# Patient Record
Sex: Female | Born: 1955
Health system: Southern US, Community
[De-identification: ages and names within clinical notes are randomized; demographics above are authoritative.]

## PROBLEM LIST (undated history)

## (undated) DIAGNOSIS — I10 Essential (primary) hypertension: Secondary | ICD-10-CM

## (undated) DIAGNOSIS — I341 Nonrheumatic mitral (valve) prolapse: Secondary | ICD-10-CM

## (undated) DIAGNOSIS — I499 Cardiac arrhythmia, unspecified: Secondary | ICD-10-CM

## (undated) HISTORY — PX: DIAGNOSTIC LAPAROSCOPY: SUR761

---

## 2010-07-24 ENCOUNTER — Other Ambulatory Visit (HOSPITAL_COMMUNITY)
Admission: RE | Admit: 2010-07-24 | Discharge: 2010-07-24 | Disposition: A | Discharge status: Home / Self Care | Payer: BC Managed Care – PPO | Source: Ambulatory Visit | Attending: Obstetrics and Gynecology | Admitting: Obstetrics and Gynecology

## 2010-07-24 DIAGNOSIS — Z01419 Encounter for gynecological examination (general) (routine) without abnormal findings: Secondary | ICD-10-CM | POA: Insufficient documentation

## 2010-08-07 ENCOUNTER — Other Ambulatory Visit: Payer: Self-pay | Admitting: Obstetrics and Gynecology

## 2010-08-07 ENCOUNTER — Encounter (HOSPITAL_COMMUNITY): Payer: BC Managed Care – PPO

## 2010-08-07 LAB — CBC
MCH: 32 pg (ref 26.0–34.0)
MCHC: 33.8 g/dL (ref 30.0–36.0)
MCV: 94.7 fL (ref 78.0–100.0)
Platelets: 244 10*3/uL (ref 150–400)
RDW: 13.4 % (ref 11.5–15.5)

## 2010-08-10 ENCOUNTER — Other Ambulatory Visit: Payer: Self-pay | Admitting: Obstetrics and Gynecology

## 2010-08-10 ENCOUNTER — Ambulatory Visit (HOSPITAL_COMMUNITY)
Admission: RE | Admit: 2010-08-10 | Discharge: 2010-08-10 | Disposition: A | Discharge status: Home / Self Care | Payer: BC Managed Care – PPO | Source: Ambulatory Visit | Attending: Obstetrics and Gynecology | Admitting: Obstetrics and Gynecology

## 2010-08-10 DIAGNOSIS — N840 Polyp of corpus uteri: Secondary | ICD-10-CM | POA: Insufficient documentation

## 2010-08-10 DIAGNOSIS — Z01812 Encounter for preprocedural laboratory examination: Secondary | ICD-10-CM | POA: Insufficient documentation

## 2010-08-10 DIAGNOSIS — Z01818 Encounter for other preprocedural examination: Secondary | ICD-10-CM | POA: Insufficient documentation

## 2010-08-13 NOTE — Op Note (Signed)
  NAMEAMEIA, Diana Case              ACCOUNT NO.:  [id number]  MEDICAL RECORD NO.:  [id number]  LOCATION:                                 FACILITY:  PHYSICIAN:  Patsy Baltimore, MD     DATE OF BIRTH:  [birth year]  DATE OF PROCEDURE: DATE OF DISCHARGE:                              OPERATIVE REPORT   PREOPERATIVE DIAGNOSIS:  Uterine polyp.  POSTOPERATIVE DIAGNOSIS:  Uterine polyp.  PROCEDURE PERFORMED:  Hysteroscopy myosure polypectomy.  SURGEON:  Patsy Baltimore, MD.  ANESTHESIA:  General.  FINDINGS:  Multiple uterine polyps.  SPECIMENS SENT:  Endometrial polyps.  ESTIMATED BLOOD LOSS:  Minimal.  COMPLICATIONS:  None.  DESCRIPTION OF PROCEDURE:  [name] is a 55 year old para 3, who was seen as an outpatient for postmenopausal bleeding.  Her workup revealed the polypoid projections within the endometrium, therefore, informed consent was obtained for removal of the polyps.  On the day of the surgery, she was taken to the operating room with IV fluids running. She was put under general anesthesia.  Her legs are lifted up to the dorsal lithotomy position.  She was prepped and draped in the usual sterile fashion and then we began the procedure.  Speculum was inserted into the vagina.  A single-toothed tenaculum was used to grasp the anterior lip of the cervix.  The myosure hysteroscope was then inserted with normal saline infusing.  There was good visualization of the endometrial cavity.  She had multiple polyps, one by the left ostia, another projecting from the anterior endometrium, and then another from the anterior lower uterine segment.  All three polyps were resected with the myosure.  At the end of the case, the uterine cavity appeared clean without any polyps remaining.  She tolerated the procedure well.  The instruments were removed from the vagina.  The puncture sites of the tenaculum were hemostatic.  The patient was reawakened and transferred to the PACU  in stable condition.          ______________________________ Patsy Baltimore, MD     CO/MEDQ  D:  08/10/2010  T:  08/11/2010  Job:  [id number]  Electronically Signed by Patsy Baltimore MD on 08/13/2010 10:01:19 AM

## 2011-08-27 DIAGNOSIS — B009 Herpesviral infection, unspecified: Secondary | ICD-10-CM | POA: Insufficient documentation

## 2012-01-06 DIAGNOSIS — Z961 Presence of intraocular lens: Secondary | ICD-10-CM | POA: Insufficient documentation

## 2012-01-06 DIAGNOSIS — H3322 Serous retinal detachment, left eye: Secondary | ICD-10-CM | POA: Insufficient documentation

## 2012-01-06 DIAGNOSIS — H33319 Horseshoe tear of retina without detachment, unspecified eye: Secondary | ICD-10-CM | POA: Insufficient documentation

## 2013-07-20 ENCOUNTER — Other Ambulatory Visit: Payer: Self-pay | Admitting: Family Medicine

## 2013-07-20 DIAGNOSIS — Z1231 Encounter for screening mammogram for malignant neoplasm of breast: Secondary | ICD-10-CM

## 2013-07-20 DIAGNOSIS — E2839 Other primary ovarian failure: Secondary | ICD-10-CM

## 2013-08-22 ENCOUNTER — Ambulatory Visit
Admission: RE | Admit: 2013-08-22 | Discharge: 2013-08-22 | Disposition: A | Discharge status: Home / Self Care | Payer: BC Managed Care – PPO | Source: Ambulatory Visit | Attending: Family Medicine | Admitting: Family Medicine

## 2013-08-22 ENCOUNTER — Other Ambulatory Visit: Payer: Self-pay | Admitting: Family Medicine

## 2013-08-22 DIAGNOSIS — Z1231 Encounter for screening mammogram for malignant neoplasm of breast: Secondary | ICD-10-CM

## 2013-08-22 DIAGNOSIS — R928 Other abnormal and inconclusive findings on diagnostic imaging of breast: Secondary | ICD-10-CM

## 2013-08-22 DIAGNOSIS — E2839 Other primary ovarian failure: Secondary | ICD-10-CM

## 2013-08-23 ENCOUNTER — Other Ambulatory Visit: Payer: Self-pay | Admitting: Family Medicine

## 2013-08-23 DIAGNOSIS — R928 Other abnormal and inconclusive findings on diagnostic imaging of breast: Secondary | ICD-10-CM

## 2013-08-24 ENCOUNTER — Other Ambulatory Visit: Payer: Self-pay | Admitting: Obstetrics & Gynecology

## 2013-08-30 ENCOUNTER — Other Ambulatory Visit: Payer: BC Managed Care – PPO

## 2013-09-03 ENCOUNTER — Other Ambulatory Visit: Payer: BC Managed Care – PPO

## 2013-09-12 ENCOUNTER — Ambulatory Visit
Admission: RE | Admit: 2013-09-12 | Discharge: 2013-09-12 | Disposition: A | Discharge status: Home / Self Care | Payer: BC Managed Care – PPO | Source: Ambulatory Visit | Attending: Family Medicine | Admitting: Family Medicine

## 2013-09-12 DIAGNOSIS — R928 Other abnormal and inconclusive findings on diagnostic imaging of breast: Secondary | ICD-10-CM

## 2013-09-14 ENCOUNTER — Other Ambulatory Visit: Payer: Self-pay | Admitting: Family Medicine

## 2013-09-14 DIAGNOSIS — R921 Mammographic calcification found on diagnostic imaging of breast: Secondary | ICD-10-CM

## 2013-12-20 NOTE — H&P (Signed)
H&P 58yo postmenopausal [id number] female who presents for laparoscopic hysterectomy, bilateral salpingectomy due to postmenopausal bleeding, complex hyperplasia and suspected uterine polyp. In review, the patient has had several episodes of postmenopausal bleeding- last episode in July. A full work up was performed including both US and EMB, which showed concern for both a uterine polyp and complex endometrial hyperplasia no atypia. US-July 2015: Anteverted uterus measuring 7.2x3.8x4.5cm with hyperechoic mass within the cavity measuring 2.2 x 1.5x1cm, total endometrial thickenss 3.1 cm. Bilateral ovaries unremarkable. In 2012, she had a similar issue, where she underwent a hysteroscopy, D&C due to uterine polyps; however it appears as though the polyp have returned. At this time, she desires to proceed with permanent resolution of these issues.       Medical History: HTN, Hyperlipidemia       Gyn History:  Sexual activity currently sexually active.  Periods : postmenopausal.  LMP 08/2008.  Denies H/O Birth control.  Last pap smear date June 2015 Negative.  Denies H/O Last mammogram date 08/31/12.  Abnormal pap smear s/p colposcopy, treated with cryo.  Menarche 11.        OB History:  Number of pregnancies 4.  miscarriages 1.  Pregnancy # 1 live birth, vaginal delivery.  Pregnancy # 2 miscarriage.  Pregnancy # 3 live birth, vaginal delivery.  Pregnancy # 4: live birth, C-section.        Surgical History: left eye repair of a retinal detachment , bilateral cataract surgery ,  c-section x1 , Hysteroscopy and Polypectomy .        Hospitalization/Major Diagnostic Procedure: childbirth .        Family History: Father: alive 1384 yrs, Bladder cancer, CAD Mother: alive 6483 yrs, Hypertension Paternal Grand Father: deceased, CADPaternal Grand  Mother: deceased 6093 yrs, Cervical CA ? Maternal Grand Father: CVA Maternal Grand Mother: deceased 8093 yrs Brother 1: alive, skin cancer       Social History:  General:  Tobacco use  cigarettes: Never smoked Tobacco history last updated 02/08/2013 no Smoking.  Alcohol: moderate to heavy.  Caffeine: None.  Recreational drug use: no.  Diet: vegetarian, low salt, low fat.  Exercise: bikes, 4 x week.  Occupation: Runner, broadcasting/film/videoteacher, Scientific laboratory technicianpart-time tutor.  Education: [employer].  Marital Status: Married.  Children: 3 adult children.    Medications: Current Medication:  Taking  Ibuprofen 200 MG Tablet 1 tablet prn     Fish Oil 1000 MG Capsule 1 capsule with a meal Once a day     Zicam Allergy Relief Gel     Norvasc 5 MG Tablet 1 tablet once a day-needs office visit before more     Tums    Review of Systems  CONSTITUTIONAL:  no Chills. no Fever. no Skin rash.  HEENT:  Blurrred vision no.  CARDIOLOGY:  no Chest pain.  RESPIRATORY:  no Shortness of breath. no Cough.  GASTROENTEROLOGY:  no Abdominal pain. no Appetite change. no Change in bowel movements.  UROLOGY:  no Urinary frequency. no Urinary incontinence. no Urinary urgency.  FEMALE REPRODUCTIVE:  no Breast lumps or discharge. no Breast pain. no Dyspareunia. no Dysuria. no Hot flashes. no Urinary leakage. no Vaginal irritation. no Vaginal itching.  NEUROLOGY:  no Dizziness. no Headache.      Examination- performed in office on 10/19  General Examination: GENERAL APPEARANCE alert, oriented, NAD, pleasant.  SKIN: normal, no rash.  LUNGS: clear to auscultation bilaterally, no wheezes, rhonchi, rales.  HEART: regular rate and rhythm, + systolic murmur (h/o mitral prolapse).  ABDOMEN: no masses palpated, soft and not tender, no rebound, no guarding.  FEMALE GENITOURINARY: No external lesions, Vagina - pink moist mucosa, no lesions or abnormal discharge, cervix - visualized, no discharge or lesions. No CMT. No adnexal masses bilaterally. Uterus: nontender and normal size on palpation.  EXTREMITIES: no edema present, normal range of motion.     A/P: 14XW [id number] who  presents for laparoscopic hysterectomy and bilateral salpingectomy due to postmenopausal bleeding, complex hyperplasia and suspected uterine polyp.  -NPO -LR @ 125cc/hr -SCDs to OR -Ancef 2g IV to OR Decision of total vs Laparoscopic-assisted hysterectomy, will be determined at time of surgery. Reviewed risk, benefit and indications- including potential risk of bleeding, infection and injury to surrounding organs including bowel, bladder, ureter and ovaries. Questions and concerns were discussed and inform consent was obtained.  Myna HidalgoJennifer Mumin Denomme, DO [phone] (pager) [phone] (office)

## 2013-12-21 ENCOUNTER — Encounter (HOSPITAL_COMMUNITY): Payer: Self-pay

## 2013-12-21 ENCOUNTER — Other Ambulatory Visit: Payer: Self-pay

## 2013-12-21 ENCOUNTER — Encounter (HOSPITAL_COMMUNITY)
Admission: RE | Admit: 2013-12-21 | Discharge: 2013-12-21 | Disposition: A | Discharge status: Home / Self Care | Payer: BC Managed Care – PPO | Source: Ambulatory Visit | Attending: Obstetrics & Gynecology | Admitting: Obstetrics & Gynecology

## 2013-12-21 DIAGNOSIS — Z01812 Encounter for preprocedural laboratory examination: Secondary | ICD-10-CM | POA: Diagnosis not present

## 2013-12-21 HISTORY — DX: Nonrheumatic mitral (valve) prolapse: I34.1

## 2013-12-21 HISTORY — DX: Cardiac arrhythmia, unspecified: I49.9

## 2013-12-21 HISTORY — DX: Essential (primary) hypertension: I10

## 2013-12-21 LAB — COMPREHENSIVE METABOLIC PANEL
ALT: 19 U/L (ref 0–35)
AST: 23 U/L (ref 0–37)
Albumin: 4 g/dL (ref 3.5–5.2)
Alkaline Phosphatase: 82 U/L (ref 39–117)
Anion gap: 13 (ref 5–15)
BILIRUBIN TOTAL: 0.2 mg/dL — AB (ref 0.3–1.2)
BUN: 16 mg/dL (ref 6–23)
CHLORIDE: 100 meq/L (ref 96–112)
CO2: 25 meq/L (ref 19–32)
CREATININE: 0.47 mg/dL — AB (ref 0.50–1.10)
Calcium: 9.6 mg/dL (ref 8.4–10.5)
GLUCOSE: 108 mg/dL — AB (ref 70–99)
Potassium: 4.1 mEq/L (ref 3.7–5.3)
Sodium: 138 mEq/L (ref 137–147)
Total Protein: 8 g/dL (ref 6.0–8.3)

## 2013-12-21 LAB — CBC
HCT: 38.4 % (ref 36.0–46.0)
Hemoglobin: 12.8 g/dL (ref 12.0–15.0)
MCH: 31.6 pg (ref 26.0–34.0)
MCHC: 33.3 g/dL (ref 30.0–36.0)
MCV: 94.8 fL (ref 78.0–100.0)
Platelets: 245 10*3/uL (ref 150–400)
RBC: 4.05 MIL/uL (ref 3.87–5.11)
RDW: 13.1 % (ref 11.5–15.5)
WBC: 8.9 10*3/uL (ref 4.0–10.5)

## 2013-12-21 LAB — TYPE AND SCREEN
ABO/RH(D): O NEG
ANTIBODY SCREEN: NEGATIVE

## 2013-12-21 LAB — ABO/RH: ABO/RH(D): O NEG

## 2013-12-21 NOTE — Patient Instructions (Addendum)
Your procedure is scheduled on:12/26/13  Enter through the Main Entrance at :1200 noon Pick up desk phone and dial [phone] and inform us of your arrival.  Please call [phone] if you have any problems the morning of surgery.  Remember: Do not eat food after midnight:Tuesday Clear liquids are ok until:9am on WED   You may brush your teeth the morning of surgery.  Take these meds the morning of surgery with a sip of water:BP med  DO NOT wear jewelry, eye make-up, lipstick,body lotion, or dark fingernail polish.  (Polished toes are ok) You may wear deodorant.  If you are to be admitted after surgery, leave suitcase in car until your room has been assigned. Patients discharged on the day of surgery will not be allowed to drive home. Wear loose fitting, comfortable clothes for your ride home.

## 2013-12-26 ENCOUNTER — Encounter (HOSPITAL_COMMUNITY): Payer: BC Managed Care – PPO | Admitting: Certified Registered Nurse Anesthetist

## 2013-12-26 ENCOUNTER — Ambulatory Visit (HOSPITAL_COMMUNITY): Payer: BC Managed Care – PPO | Admitting: Certified Registered Nurse Anesthetist

## 2013-12-26 ENCOUNTER — Encounter (HOSPITAL_COMMUNITY)
Admission: RE | Disposition: A | Discharge status: Home / Self Care | Payer: Self-pay | Source: Ambulatory Visit | Attending: Obstetrics & Gynecology

## 2013-12-26 ENCOUNTER — Observation Stay (HOSPITAL_COMMUNITY)
Admission: RE | Admit: 2013-12-26 | Discharge: 2013-12-27 | Disposition: A | Discharge status: Home / Self Care | Payer: BC Managed Care – PPO | Source: Ambulatory Visit | Attending: Obstetrics & Gynecology | Admitting: Obstetrics & Gynecology

## 2013-12-26 ENCOUNTER — Encounter (HOSPITAL_COMMUNITY): Payer: Self-pay | Admitting: Anesthesiology

## 2013-12-26 DIAGNOSIS — N840 Polyp of corpus uteri: Secondary | ICD-10-CM | POA: Insufficient documentation

## 2013-12-26 DIAGNOSIS — E785 Hyperlipidemia, unspecified: Secondary | ICD-10-CM | POA: Diagnosis not present

## 2013-12-26 DIAGNOSIS — I10 Essential (primary) hypertension: Secondary | ICD-10-CM | POA: Diagnosis not present

## 2013-12-26 DIAGNOSIS — N950 Postmenopausal bleeding: Principal | ICD-10-CM | POA: Diagnosis present

## 2013-12-26 HISTORY — PX: LAPAROSCOPIC ASSISTED VAGINAL HYSTERECTOMY: SHX5398

## 2013-12-26 SURGERY — HYSTERECTOMY, VAGINAL, LAPAROSCOPY-ASSISTED
Anesthesia: General | Site: Abdomen

## 2013-12-26 MED ORDER — LIDOCAINE HCL (CARDIAC) 20 MG/ML IV SOLN
INTRAVENOUS | Status: AC
  Filled 2013-12-26: qty 5

## 2013-12-26 MED ORDER — GLYCOPYRROLATE 0.2 MG/ML IJ SOLN
INTRAMUSCULAR | Status: AC
  Filled 2013-12-26: qty 2

## 2013-12-26 MED ORDER — KETOROLAC TROMETHAMINE 30 MG/ML IJ SOLN
30.0000 mg | Freq: Four times a day (QID) | INTRAMUSCULAR | Status: DC
  Administered 2013-12-27 (×2): 30 mg via INTRAVENOUS
  Filled 2013-12-26 (×2): qty 1

## 2013-12-26 MED ORDER — ONDANSETRON HCL 4 MG/2ML IJ SOLN: 4.0000 mg | Freq: Four times a day (QID) | INTRAMUSCULAR | Status: DC | PRN

## 2013-12-26 MED ORDER — SCOPOLAMINE 1 MG/3DAYS TD PT72
MEDICATED_PATCH | TRANSDERMAL | Status: AC
  Administered 2013-12-26: 1.5 mg via TRANSDERMAL
  Filled 2013-12-26: qty 1

## 2013-12-26 MED ORDER — DOCUSATE SODIUM 100 MG PO CAPS
100.0000 mg | ORAL_CAPSULE | Freq: Two times a day (BID) | ORAL | Status: DC
  Administered 2013-12-26 – 2013-12-27 (×2): 100 mg via ORAL
  Filled 2013-12-26 (×2): qty 1

## 2013-12-26 MED ORDER — CEFAZOLIN SODIUM-DEXTROSE 2-3 GM-% IV SOLR
INTRAVENOUS | Status: AC
  Filled 2013-12-26: qty 50

## 2013-12-26 MED ORDER — FENTANYL CITRATE 0.05 MG/ML IJ SOLN
INTRAMUSCULAR | Status: DC | PRN
  Administered 2013-12-26 (×2): 50 ug via INTRAVENOUS
  Administered 2013-12-26: 150 ug via INTRAVENOUS

## 2013-12-26 MED ORDER — 0.9 % SODIUM CHLORIDE (POUR BTL) OPTIME
TOPICAL | Status: DC | PRN
  Administered 2013-12-26: 2000 mL

## 2013-12-26 MED ORDER — BUPIVACAINE HCL (PF) 0.25 % IJ SOLN
INTRAMUSCULAR | Status: AC
  Filled 2013-12-26: qty 30

## 2013-12-26 MED ORDER — LIDOCAINE HCL (CARDIAC) 20 MG/ML IV SOLN
INTRAVENOUS | Status: DC | PRN
Start: 2013-12-26 — End: 2013-12-26
  Administered 2013-12-26: 80 mg via INTRAVENOUS

## 2013-12-26 MED ORDER — KETOROLAC TROMETHAMINE 30 MG/ML IJ SOLN
30.0000 mg | Freq: Four times a day (QID) | INTRAMUSCULAR | Status: DC
Start: 2013-12-27 — End: 2013-12-26
  Filled 2013-12-26: qty 1

## 2013-12-26 MED ORDER — BISACODYL 5 MG PO TBEC
5.0000 mg | DELAYED_RELEASE_TABLET | Freq: Every day | ORAL | Status: DC | PRN
  Filled 2013-12-26: qty 1

## 2013-12-26 MED ORDER — KETOROLAC TROMETHAMINE 30 MG/ML IJ SOLN
30.0000 mg | Freq: Once | INTRAMUSCULAR | Status: AC
  Administered 2013-12-26: 30 mg via INTRAVENOUS

## 2013-12-26 MED ORDER — ONDANSETRON HCL 4 MG PO TABS: 4.0000 mg | ORAL_TABLET | Freq: Four times a day (QID) | ORAL | Status: DC | PRN

## 2013-12-26 MED ORDER — SIMETHICONE 80 MG PO CHEW: 80.0000 mg | CHEWABLE_TABLET | Freq: Four times a day (QID) | ORAL | Status: DC | PRN

## 2013-12-26 MED ORDER — ROCURONIUM BROMIDE 100 MG/10ML IV SOLN
INTRAVENOUS | Status: DC | PRN
  Administered 2013-12-26: 10 mg via INTRAVENOUS
  Administered 2013-12-26: 40 mg via INTRAVENOUS

## 2013-12-26 MED ORDER — NEOSTIGMINE METHYLSULFATE 10 MG/10ML IV SOLN
INTRAVENOUS | Status: DC | PRN
  Administered 2013-12-26: 1.5 mg via INTRAVENOUS

## 2013-12-26 MED ORDER — FENTANYL CITRATE 0.05 MG/ML IJ SOLN
INTRAMUSCULAR | Status: AC
  Filled 2013-12-26: qty 5

## 2013-12-26 MED ORDER — KETOROLAC TROMETHAMINE 30 MG/ML IJ SOLN: 30.0000 mg | Freq: Four times a day (QID) | INTRAMUSCULAR | Status: DC

## 2013-12-26 MED ORDER — ONDANSETRON HCL 4 MG/2ML IJ SOLN
INTRAMUSCULAR | Status: AC
  Filled 2013-12-26: qty 2

## 2013-12-26 MED ORDER — LACTATED RINGERS IV SOLN: INTRAVENOUS | Status: DC

## 2013-12-26 MED ORDER — OXYCODONE-ACETAMINOPHEN 5-325 MG PO TABS: 1.0000 | ORAL_TABLET | ORAL | Status: DC | PRN

## 2013-12-26 MED ORDER — HYDROMORPHONE HCL 1 MG/ML IJ SOLN
INTRAMUSCULAR | Status: AC
  Filled 2013-12-26: qty 1

## 2013-12-26 MED ORDER — ONDANSETRON HCL 4 MG/2ML IJ SOLN
INTRAMUSCULAR | Status: DC | PRN
  Administered 2013-12-26: 4 mg via INTRAVENOUS

## 2013-12-26 MED ORDER — HYDROMORPHONE HCL 1 MG/ML IJ SOLN: 0.2500 mg | INTRAMUSCULAR | Status: DC | PRN

## 2013-12-26 MED ORDER — BUPIVACAINE HCL (PF) 0.25 % IJ SOLN
INTRAMUSCULAR | Status: DC | PRN
  Administered 2013-12-26: 10 mL

## 2013-12-26 MED ORDER — IBUPROFEN 600 MG PO TABS: 600.0000 mg | ORAL_TABLET | Freq: Four times a day (QID) | ORAL | Status: DC | PRN

## 2013-12-26 MED ORDER — LACTATED RINGERS IV SOLN
INTRAVENOUS | Status: DC
  Administered 2013-12-26 (×2): via INTRAVENOUS

## 2013-12-26 MED ORDER — DEXAMETHASONE SODIUM PHOSPHATE 10 MG/ML IJ SOLN
INTRAMUSCULAR | Status: DC | PRN
Start: 2013-12-26 — End: 2013-12-26
  Administered 2013-12-26: 4 mg via INTRAVENOUS

## 2013-12-26 MED ORDER — LIDOCAINE-EPINEPHRINE (PF) 1 %-1:200000 IJ SOLN
INTRAMUSCULAR | Status: AC
  Filled 2013-12-26: qty 10

## 2013-12-26 MED ORDER — SCOPOLAMINE 1 MG/3DAYS TD PT72
1.0000 | MEDICATED_PATCH | Freq: Once | TRANSDERMAL | Status: DC
  Administered 2013-12-26: 1.5 mg via TRANSDERMAL

## 2013-12-26 MED ORDER — MENTHOL 3 MG MT LOZG: 1.0000 | LOZENGE | OROMUCOSAL | Status: DC | PRN

## 2013-12-26 MED ORDER — INFLUENZA VAC SPLIT QUAD 0.5 ML IM SUSY
0.5000 mL | PREFILLED_SYRINGE | INTRAMUSCULAR | Status: AC
  Administered 2013-12-27: 0.5 mL via INTRAMUSCULAR
  Filled 2013-12-26: qty 0.5

## 2013-12-26 MED ORDER — ESMOLOL HCL 10 MG/ML IV SOLN
INTRAVENOUS | Status: DC | PRN
  Administered 2013-12-26: 30 mg via INTRAVENOUS

## 2013-12-26 MED ORDER — MIDAZOLAM HCL 2 MG/2ML IJ SOLN
INTRAMUSCULAR | Status: DC | PRN
  Administered 2013-12-26: 2 mg via INTRAVENOUS

## 2013-12-26 MED ORDER — HYDROMORPHONE HCL 1 MG/ML IJ SOLN
INTRAMUSCULAR | Status: DC | PRN
  Administered 2013-12-26 (×4): 0.5 mg via INTRAVENOUS

## 2013-12-26 MED ORDER — LIDOCAINE-EPINEPHRINE (PF) 1 %-1:200000 IJ SOLN
INTRAMUSCULAR | Status: DC | PRN
  Administered 2013-12-26: 17 mL

## 2013-12-26 MED ORDER — NEOSTIGMINE METHYLSULFATE 10 MG/10ML IV SOLN
INTRAVENOUS | Status: AC
  Filled 2013-12-26: qty 1

## 2013-12-26 MED ORDER — ROCURONIUM BROMIDE 100 MG/10ML IV SOLN
INTRAVENOUS | Status: AC
  Filled 2013-12-26: qty 1

## 2013-12-26 MED ORDER — PROPOFOL 10 MG/ML IV EMUL
INTRAVENOUS | Status: AC
  Filled 2013-12-26: qty 20

## 2013-12-26 MED ORDER — MIDAZOLAM HCL 2 MG/2ML IJ SOLN
INTRAMUSCULAR | Status: AC
  Filled 2013-12-26: qty 2

## 2013-12-26 MED ORDER — PROPOFOL 10 MG/ML IV BOLUS
INTRAVENOUS | Status: DC | PRN
  Administered 2013-12-26: 50 mg via INTRAVENOUS
  Administered 2013-12-26: 150 mg via INTRAVENOUS

## 2013-12-26 MED ORDER — CEFAZOLIN SODIUM-DEXTROSE 2-3 GM-% IV SOLR
2.0000 g | INTRAVENOUS | Status: AC
  Administered 2013-12-26: 2 g via INTRAVENOUS

## 2013-12-26 MED ORDER — KETOROLAC TROMETHAMINE 30 MG/ML IJ SOLN
INTRAMUSCULAR | Status: AC
  Filled 2013-12-26: qty 1

## 2013-12-26 MED ORDER — GLYCOPYRROLATE 0.2 MG/ML IJ SOLN
INTRAMUSCULAR | Status: DC | PRN
  Administered 2013-12-26: 0.2 mg via INTRAVENOUS

## 2013-12-26 SURGICAL SUPPLY — 40 items
CABLE HIGH FREQUENCY MONO STRZ (ELECTRODE) IMPLANT
CLOTH BEACON ORANGE TIMEOUT ST (SAFETY) ×3 IMPLANT
DRSG COVADERM PLUS 2X2 (GAUZE/BANDAGES/DRESSINGS) ×3 IMPLANT
DRSG OPSITE POSTOP 3X4 (GAUZE/BANDAGES/DRESSINGS) ×3 IMPLANT
DURAPREP 26ML APPLICATOR (WOUND CARE) ×6 IMPLANT
ELECT LIGASURE SHORT 9 REUSE (ELECTRODE) IMPLANT
FORCEPS CUTTING 33CM 5MM (CUTTING FORCEPS) IMPLANT
GAUZE PACKING IODOFORM 2 (PACKING) ×3 IMPLANT
GLOVE BIOGEL PI IND STRL 6.5 (GLOVE) ×4 IMPLANT
GLOVE BIOGEL PI INDICATOR 6.5 (GLOVE) ×2
GLOVE ECLIPSE 6.5 STRL STRAW (GLOVE) ×6 IMPLANT
GOWN STRL REUS W/ TWL LRG LVL3 (GOWN DISPOSABLE) ×14 IMPLANT
GOWN STRL REUS W/TWL LRG LVL3 (GOWN DISPOSABLE) ×7
LIQUID BAND (GAUZE/BANDAGES/DRESSINGS) ×3 IMPLANT
NEEDLE INSUFFLATION 120MM (ENDOMECHANICALS) IMPLANT
OCCLUDER COLPOPNEUMO (BALLOONS) ×3 IMPLANT
PACK LAVH (CUSTOM PROCEDURE TRAY) ×3 IMPLANT
PAD OB MATERNITY 4.3X12.25 (PERSONAL CARE ITEMS) ×3 IMPLANT
PAD TRENDELENBURG OR TABLE (MISCELLANEOUS) ×3 IMPLANT
PROTECTOR NERVE ULNAR (MISCELLANEOUS) ×3 IMPLANT
SET IRRIG TUBING LAPAROSCOPIC (IRRIGATION / IRRIGATOR) IMPLANT
SHEARS HARMONIC ACE PLUS 36CM (ENDOMECHANICALS) IMPLANT
SLEEVE XCEL OPT CAN 5 100 (ENDOMECHANICALS) ×6 IMPLANT
STRIP CLOSURE SKIN 1/4X4 (GAUZE/BANDAGES/DRESSINGS) IMPLANT
SUT MON AB 4-0 PS1 27 (SUTURE) ×3 IMPLANT
SUT VIC AB 0 CT1 18XCR BRD8 (SUTURE) ×2 IMPLANT
SUT VIC AB 0 CT1 27 (SUTURE) ×2
SUT VIC AB 0 CT1 27XBRD ANBCTR (SUTURE) ×4 IMPLANT
SUT VIC AB 0 CT1 36 (SUTURE) ×9 IMPLANT
SUT VIC AB 0 CT1 8-18 (SUTURE) ×1
SUT VICRYL 0 UR6 27IN ABS (SUTURE) IMPLANT
TIP UTERINE 5.1X6CM LAV DISP (MISCELLANEOUS) IMPLANT
TIP UTERINE 6.7X10CM GRN DISP (MISCELLANEOUS) IMPLANT
TIP UTERINE 6.7X6CM WHT DISP (MISCELLANEOUS) IMPLANT
TIP UTERINE 6.7X8CM BLUE DISP (MISCELLANEOUS) IMPLANT
TOWEL OR 17X24 6PK STRL BLUE (TOWEL DISPOSABLE) ×6 IMPLANT
TRAY FOLEY CATH 14FR (SET/KITS/TRAYS/PACK) ×3 IMPLANT
TROCAR XCEL NON-BLD 5MMX100MML (ENDOMECHANICALS) ×3 IMPLANT
WARMER LAPAROSCOPE (MISCELLANEOUS) ×3 IMPLANT
WATER STERILE IRR 1000ML POUR (IV SOLUTION) ×3 IMPLANT

## 2013-12-26 NOTE — Anesthesia Postprocedure Evaluation (Signed)
  Anesthesia Post-op Note  Patient: Diana Case  Procedure(s) Performed: Procedure(s): LAPAROSCOPIC ASSISTED VAGINAL HYSTERECTOMY (N/A)  Patient Location: PACU  Anesthesia Type:General  Level of Consciousness: awake, alert  and oriented  Airway and Oxygen Therapy: Patient Spontanous Breathing  Post-op Pain: mild  Post-op Assessment: Post-op Vital signs reviewed, Patient's Cardiovascular Status Stable, Respiratory Function Stable, Patent Airway, No signs of Nausea or vomiting and Pain level controlled  Post-op Vital Signs: Reviewed and stable  Last Vitals:  Filed Vitals:   12/26/13 1615  BP: 130/73  Pulse: 95  Temp:   Resp: 15    Complications: No apparent anesthesia complications

## 2013-12-26 NOTE — Anesthesia Preprocedure Evaluation (Signed)
Anesthesia Evaluation  Patient identified by MRN, date of birth, ID band Patient awake    Reviewed: Allergy & Precautions, H&P , Patient's Chart, lab work & pertinent test results, reviewed documented beta blocker date and time   Airway Mallampati: II TM Distance: >3 FB Neck ROM: full    Dental no notable dental hx.    Pulmonary  breath sounds clear to auscultation  Pulmonary exam normal       Cardiovascular hypertension, Rhythm:regular Rate:Normal     Neuro/Psych    GI/Hepatic   Endo/Other    Renal/GU      Musculoskeletal   Abdominal   Peds  Hematology   Anesthesia Other Findings   Reproductive/Obstetrics                           Anesthesia Physical Anesthesia Plan  ASA: II  Anesthesia Plan: General   Post-op Pain Management:    Induction: Intravenous  Airway Management Planned: Oral ETT  Additional Equipment:   Intra-op Plan:   Post-operative Plan: Extubation in OR  Informed Consent: I have reviewed the patients History and Physical, chart, labs and discussed the procedure including the risks, benefits and alternatives for the proposed anesthesia with the patient or authorized representative who has indicated his/her understanding and acceptance.   Dental Advisory Given and Dental advisory given  Plan Discussed with: CRNA and Surgeon  Anesthesia Plan Comments: (  Discussed general anesthesia, including possible nausea, instrumentation of airway, sore throat,pulmonary aspiration, etc. I asked if the were any outstanding questions, or  concerns before we proceeded. )        Anesthesia Quick Evaluation  

## 2013-12-26 NOTE — Interval H&P Note (Signed)
History and Physical Interval Note:  12/26/2013 12:39 PM  Diana Case  has presented today for surgery, with the diagnosis of   The various methods of treatment have been discussed with the patient and family. After consideration of risks, benefits and other options for treatment, the patient has consented to  Procedure(s):  Laparoscopic assisted vaginal HYSTERECTOMY, bilateral salpingectomy as a surgical intervention .  The patient's history has been reviewed, patient examined, no change in status, stable for surgery.  I have reviewed the patient's chart and labs.  Questions were answered to the patient's satisfaction.     Myna HidalgoZAN, Clayson Riling, M

## 2013-12-26 NOTE — Op Note (Signed)
Preoperative Diagnosis: Postmenopausal bleeding, complex hyperplasia, uterine polyp Postoperative Diagnosis: same  Procedure: Laparoscopy Assisted Vaginal Hysterectomy, Bilateral salpingectomy, modified mcCall's culdoplasty Surgeon: Dr. Myna HidalgoJennifer Daphine Loch  Assistant: Dr. Gerald Leitzara Cole Anesthetic: General  IVF: 2000cc EBL: 250cc  UOP: 250cc  Specimens: 1) Uterus with bilateral fallopian tubes Findings: No free fluid or omental studding appreciated. Normal appearing liver, gallbladder and bowel. Normal sized anteverted uterus, normal fallopian tubes and ovaries bilaterally. Procedure: The patient was taken to the operating room where general anesthesia was found to be adequate. The patient's abdomen was prepped with ChloraPrep. The perineum and vagina were prepped with multiple layers of Betadine. The patient was sterilely draped. A Foley catheter was placed in the bladder. A Hulka tenaculum was placed inside the uterus. Gown and gloves were changed and attention was turned to the abdomen. An incision was made in the supraumbilical area and the Veress needle was inserted into the abdominal cavity without difficulty. Proper placement was confirmed using the saline drop test and opening pressure was 3 mmHg. A pneumoperitoneum was obtained. The laparoscopic trocar and the laparoscope were placed under direct visualization. Two additional ports were placed in the right and left lower quadrants. Each area was injected with half percent Marcaine. A small incision was made and a 5 mm trocar was inserted into the abdominal cavity under direct visualization. An abdominal scan was performed with the findings noted above.   Attention was turned to the left adnexa and the ureter was idenitifed. The left fallopian tube was grasped and serial ligation was performed up to the cornua of the uterus using the Harmonic. The left round ligament was then grasped, elevated, fulgurated and divided using the Harmonic. The anterior leaf of  the broad ligament was dissected medially and inferiorly to allow dissection of the vesicouterine flap.  The utero-ovarian ligament was also ligated using the Harmonic. Attention was turned to the right adnexa. In a similar fashion, the right ureter was identified.  The right fallopian tube was grasped and ligated using the Harmonic.  The right round ligament was clamped, elevated, ligated, and divided with the Harmonic. The vesicouterine fold of peritoneum was incised anteriorly for full dissection of the vesicouterine flap. Hemostasis was noted. The case then proceeded to the vaginal portion.  The Hulka was removed and a weighted speculum was placed in the posterior vagina. The cervix was injected with half percent Marcaine with epinephrine. The cervix was then circumferentially incised with the scalpel and the bladder was dissected off the pubovesical cervical fascia.   Due to the proximity of the bladder to the cervix and concern for entry, attention was turned to the posterior cul-de-sac.  The posterior cul-de-sac was entered sharply using the Metzenbaum scissors.  Once peritoneal entry was confirmed attention was turned back to the anterior cul-de-sac and entered sharply.  A heany clamp was placed over the uterosacral ligaments bilaterally. These were transected and suture ligated with 0 vicryl. The cardinal ligaments were then clamped bilaterally and transected and suture ligated in a similar fashion. The uterine arteries and broad ligament was then serially clamped with heany clamps, transected and suture ligated bilaterally. The uterus, bilateral fallopian tube were removed from the operative field and sent to pathology. Hemostasis was confirmed. A final check was made for hemostasis and confirmed. The vaginal cuff angles were closed with an angle suture of 0 vicryl and transfixed to the ipsilateral uterosacral ligaments. A McCall culdoplasty suture was placed in the posterior cul-de-sac incorporating the  uterosacral ligaments bilaterally and  the posterior peritoneum.The remainder of the vaginal cuff was closed with 0 vicryl in a running locked fashion.  Vaginal packing was placed. Gown and gloves were changed and attention was turned to the abdomen.  The pneumoperitoneum was reestablished. The pelvis was inspected and excellent hemostasis was noted.  The lateral 5 mm trochars were removed under direct visualization. The pneumoperitoneum was allowed to escape. The subumbilical trocar was removed. Dermabond was placed over the three trocar sites. The patient tolerated her procedure well. She was awakened from her anesthetic without difficulty and then transported to the recovery room in stable condition. Sponge, needle, and instrument counts were correct. Dr. Richardson Doppole assisted in completion of the entire surgical case due to concern for visualization and potential complications.  Myna HidalgoJennifer Khari Lett, DO  [phone] (pager)  [phone] (office)

## 2013-12-26 NOTE — Transfer of Care (Signed)
Immediate Anesthesia Transfer of Care Note  Patient: Diana Case  Procedure(s) Performed: Procedure(s): LAPAROSCOPIC ASSISTED VAGINAL HYSTERECTOMY (N/A)  Patient Location: PACU  Anesthesia Type:General  Level of Consciousness: awake, alert  and oriented  Airway & Oxygen Therapy: Patient Spontanous Breathing and Patient connected to nasal cannula oxygen  Post-op Assessment: Report given to PACU RN and Post -op Vital signs reviewed and stable  Post vital signs: Reviewed and stable  Complications: No apparent anesthesia complications

## 2013-12-27 ENCOUNTER — Encounter (HOSPITAL_COMMUNITY): Payer: Self-pay | Admitting: Obstetrics & Gynecology

## 2013-12-27 DIAGNOSIS — N950 Postmenopausal bleeding: Secondary | ICD-10-CM | POA: Diagnosis not present

## 2013-12-27 MED ORDER — OXYCODONE-ACETAMINOPHEN 5-325 MG PO TABS: 1.0000 | ORAL_TABLET | Freq: Four times a day (QID) | ORAL | Status: DC | PRN

## 2013-12-27 MED ORDER — DSS 100 MG PO CAPS: 100.0000 mg | ORAL_CAPSULE | Freq: Two times a day (BID) | ORAL | Status: DC

## 2013-12-27 MED ORDER — IBUPROFEN 600 MG PO TABS: 600.0000 mg | ORAL_TABLET | Freq: Four times a day (QID) | ORAL | Status: DC | PRN

## 2013-12-27 NOTE — Anesthesia Postprocedure Evaluation (Signed)
  Anesthesia Post-op Note  Anesthesia Post Note  Patient: Diana FarrJennie S Case  Procedure(s) Performed: Procedure(s) (LRB): LAPAROSCOPIC ASSISTED VAGINAL HYSTERECTOMY (N/A)  Anesthesia type: General  Patient location: [facility]  Post pain: Pain level controlled  Post assessment: Post-op Vital signs reviewed  Last Vitals:  Filed Vitals:   12/27/13 0537  BP: 116/68  Pulse: 87  Temp: 37 C  Resp: 18    Post vital signs: Reviewed  Level of consciousness: sedated  Complications: No apparent anesthesia complications

## 2013-12-27 NOTE — Addendum Note (Signed)
Addendum created 12/27/13 0736 by Turner DanielsJennifer L Keijuan Schellhase, CRNA   Modules edited: Notes Section   Notes Section:  File: [id number]

## 2013-12-27 NOTE — Progress Notes (Signed)
Postoperative Note Day # 1  S:  Patient resting comfortable in bed.  Pain controlled.  Tolerating liquid diet.  Pt had emesis of broth yesterday, has not yet had general diet this am. + flatus, no BM.   Ambulating without difficulty.  She denies n/v/f/c, SOB, or CP.  Foley and vaginal packing in place  O: Temp:  [97.6 F (36.4 C)-98.9 F (37.2 C)] 98.6 F (37 C) (10/29 0537) Pulse Rate:  [73-114] 87 (10/29 0537) Resp:  [9-20] 18 (10/29 0537) BP: (102-146)/(61-74) 116/68 mmHg (10/29 0537) SpO2:  [96 %-99 %] 97 % (10/29 0537) Weight:  [65.772 kg (145 lb)] 65.772 kg (145 lb) (10/28 1721)  Gen: A&Ox3, NAD CV: RRR, no MRG Resp: CTAB Abdomen: soft, NT, ND +BS Trocar Incisions: c/d/i, bandage on GU: Vaginal packing removed this am, minimal blood on packing.  Foley removed without difficulty Ext: No edema, no calf tenderness bilaterally, SCDs in place  A/P: Pt is a 58 y.o. postmenopausal female s/p LAVH, BS, POD #1  - Pain well controlled, will transition to oral Motrin and Percocet -GU: UOP is adequate, foley removed this am.  Pt to void by herself this am -GI: Pt to be advanced to general diet this am -Activity: encouraged sitting up to chair and ambulation as tolerated -Prophylaxis: SCDs in place, encourage ambulation -Labs:  CBC, BMP cancelled this am as unable to collect this am -HTN: BP medication on hold, will follow as outpatient  DISPO:  Once patient is able to void freely and tolerating general diet, will plan for discharge home later today.  Myna HidalgoJennifer Beuford Garcilazo, DO [phone] (pager) [phone] (office)

## 2013-12-27 NOTE — Progress Notes (Signed)
Luisa Dagoanya Corbitt, RN did d/c instructions and states that pt verbalizes understanding of all instructions. IV was d/c prior to d/c. Pt d/c with instructions and prescription in hand. Sheryn BisonGordon, Tavaughn Silguero Warner

## 2013-12-27 NOTE — Discharge Instructions (Signed)
Laparoscopically Assisted Vaginal Hysterectomy, Care After Refer to this sheet in the next few weeks. These instructions provide you with information on caring for yourself after your procedure. Your health care provider may also give you more specific instructions. Your treatment has been planned according to current medical practices, but problems sometimes occur. Call your health care provider if you have any problems or questions after your procedure.  WHAT TO EXPECT AFTER THE PROCEDURE After your procedure, it is typical to have the following:  Abdominal pain. You will be given pain medicine to control it.  Sore throat from the breathing tube that was inserted during surgery. HOME CARE INSTRUCTIONS  Only take over-the-counter or prescription medicines for pain, discomfort, or fever as directed by your health care provider.  Alternate between the Percocet for moderate pain and Motrin for mild pain.  Percocet may cause constipation, so please continue Colace (stool softener) while taking the Percocet.  Do not take aspirin. It can cause bleeding.  Do not drive when taking pain medicine.  Follow your health care provider's advice regarding diet, exercise, lifting, driving, and general activities. No lifting of more than 10-15lbs.  Activity as tolerated.  Resume your usual diet as directed and allowed.  Get plenty of rest and sleep.  Do not douche, use tampons, or have sexual intercourse for at least 6 weeks, or until your health care provider gives you permission.  Monitor your temperature and notify your health care provider of a fever.  Take showers instead of baths for 2-3 weeks.  Do not drink alcohol until your health care provider gives you permission.  If you develop constipation, you may take a mild laxative with your health care provider's permission. Bran foods may help with constipation problems. Drinking enough fluids to keep your urine clear or pale yellow may help as  well.  Try to have someone home with you for 1-2 weeks to help around the house.  Keep all of your follow-up appointments as directed by your health care provider.  Due to recent surgery, you may hold off on starting your blood pressure medication for one week.  If possible check your blood pressure at home.  Once either value is higher than 140/90, you may resume your medication. SEEK MEDICAL CARE IF:   You have swelling, redness, or increasing pain around your incision sites.  You have pus coming from your incision.  You notice a bad smell coming from your incision.  Your incision breaks open.  You feel dizzy or lightheaded.  You have pain or bleeding when you urinate.  You have persistent diarrhea.  You have persistent nausea and vomiting.  You have abnormal vaginal discharge.  You have a rash.  You have any type of abnormal reaction or develop an allergy to your medicine.  You have poor pain control with your prescribed medicine. SEEK IMMEDIATE MEDICAL CARE IF:   You have a fever.  You have severe abdominal pain.  You have chest pain.  You have shortness of breath.  You faint.  You have pain, swelling, or redness in your leg.  You have heavy vaginal bleeding with blood clots. MAKE SURE YOU:  Understand these instructions.  Will watch your condition.  Will get help right away if you are not doing well or get worse. Document Released: 02/04/2011 Document Revised: 02/20/2013 Document Reviewed: 08/31/2012 [facility] Patient Information 2015 [employer], [place]. This information is not intended to replace advice given to you by your health care provider. Make sure you  discuss any questions you have with your health care provider. ° °

## 2014-01-06 NOTE — Discharge Summary (Signed)
Physician Discharge Summary  Patient ID: Diana Case MRN: [id number] DOB/AGE: June 08, 1955 58 y.o.  Admit date: 12/26/2013 Discharge date: 12/27/13 Admission Diagnoses: 1) Postmenopausal bleeding 2) Uterine polyps 3) Endometrial hyperplasia Discharge Diagnoses: same  Discharged Condition: stable  Hospital Course: 58yo postmenopausal [phone] female who presents for laparoscopic hysterectomy, bilateral salpingectomy due to postmenopausal bleeding, complex hyperplasia and suspected uterine polyp. In review, the patient has had several episodes of postmenopausal bleeding- last episode in July. A full work up was performed including both [place] and EMB, which showed concern for both a uterine polyp and complex endometrial hyperplasia no atypia. US-July 2015: Anteverted uterus measuring 7.2x3.8x4.5cm with hyperechoic mass within the cavity measuring 2.2 x 1.5x1cm, total endometrial thickenss 3.1 cm. Bilateral ovaries unremarkable.  The patient underwent a laparoscopic assisted vaginal hysterectomy, bilateral salpingectomy on 12/26/13.  The procedure went as planned, please see the operative report for further information.  Her postoperative course was uncomplicated as she met her postoperative milestones appropriately and was discharged home in stable condition on POD #1.   Consults: None  Significant Diagnostic Studies: labs: none  Treatments: IV hydration, antibiotics: Ancef, analgesia: Toradol, Percocet and surgery: LAVH, BS  Discharge Exam: Blood pressure 118/59, pulse 86, temperature 98 F (36.7 C), temperature source Axillary, resp. rate 18, height 5' 3"  (1.6 m), weight 65.772 kg (145 lb), SpO2 98 %. Gen: A&Ox3, NAD CV: RRR, no MRG Resp: CTAB Abdomen: soft, NT, ND +BS Trocar Incisions: c/d/i, bandage on GU: Vaginal packing removed this am, minimal blood on packing. Foley removed without difficulty Ext: No edema, no calf tenderness bilaterally, SCDs in place  Disposition: 01-Home or  Self Care     Medication List    STOP taking these medications        amLODipine 5 MG tablet  Commonly known as:  NORVASC      TAKE these medications        cholecalciferol 1000 UNITS tablet  Commonly known as:  VITAMIN D  Take 1,000 Units by mouth daily.     DSS 100 MG Caps  Take 100 mg by mouth 2 (two) times daily.     FISH OIL + D3 PO  Take by mouth.     ibuprofen 600 MG tablet  Commonly known as:  ADVIL,MOTRIN  Take 1 tablet (600 mg total) by mouth every 6 (six) hours as needed for mild pain.     oxyCODONE-acetaminophen 5-325 MG per tablet  Commonly known as:  PERCOCET/ROXICET  Take 1 tablet by mouth every 6 (six) hours as needed (moderate to severe pain (when tolerating fluids)).           Follow-up Information    Follow up with Janyth Pupa, M, DO In 2 weeks.   Specialty:  Obstetrics and Gynecology   Contact information:   [address] [place] [id number] [phone]       Signed: Annalee Genta 01/06/2014, 1:27 PM

## 2014-09-30 ENCOUNTER — Ambulatory Visit
Admission: RE | Admit: 2014-09-30 | Discharge: 2014-09-30 | Disposition: A | Discharge status: Home / Self Care | Payer: BLUE CROSS/BLUE SHIELD | Source: Ambulatory Visit | Attending: Family Medicine | Admitting: Family Medicine

## 2014-09-30 ENCOUNTER — Other Ambulatory Visit: Payer: Self-pay | Admitting: Family Medicine

## 2014-09-30 DIAGNOSIS — R05 Cough: Secondary | ICD-10-CM

## 2014-09-30 DIAGNOSIS — R053 Chronic cough: Secondary | ICD-10-CM

## 2014-12-31 ENCOUNTER — Ambulatory Visit (HOSPITAL_COMMUNITY): Payer: Self-pay | Admitting: Psychiatry

## 2015-09-16 ENCOUNTER — Other Ambulatory Visit: Payer: Self-pay | Admitting: General Surgery

## 2016-02-06 ENCOUNTER — Other Ambulatory Visit: Payer: Self-pay | Admitting: Family

## 2016-02-06 DIAGNOSIS — M8580 Other specified disorders of bone density and structure, unspecified site: Secondary | ICD-10-CM

## 2016-04-19 ENCOUNTER — Ambulatory Visit (HOSPITAL_COMMUNITY)
Admission: EM | Admit: 2016-04-19 | Discharge: 2016-04-19 | Disposition: A | Discharge status: Home / Self Care | Payer: BLUE CROSS/BLUE SHIELD | Attending: Internal Medicine | Admitting: Internal Medicine

## 2016-04-19 ENCOUNTER — Encounter (HOSPITAL_COMMUNITY): Payer: Self-pay | Admitting: Emergency Medicine

## 2016-04-19 DIAGNOSIS — J069 Acute upper respiratory infection, unspecified: Secondary | ICD-10-CM

## 2016-04-19 DIAGNOSIS — B9789 Other viral agents as the cause of diseases classified elsewhere: Secondary | ICD-10-CM | POA: Diagnosis not present

## 2016-04-19 MED ORDER — IPRATROPIUM BROMIDE 0.06 % NA SOLN: 2.0000 | Freq: Four times a day (QID) | NASAL | 12 refills | Status: DC

## 2016-04-19 MED ORDER — BENZONATATE 100 MG PO CAPS: 100.0000 mg | ORAL_CAPSULE | Freq: Three times a day (TID) | ORAL | 0 refills | Status: DC

## 2016-04-19 NOTE — ED Triage Notes (Signed)
[facility] with a complaint of nasal congestion that started yesterday and general body aches that started today.

## 2016-04-19 NOTE — Discharge Instructions (Signed)
You most likely have a viral URI, I advise rest, plenty of fluids and management of symptoms with over the counter medicines. For symptoms you may take Tylenol as needed every 4-6 hours for body aches or fever, not to exceed 4,000 mg a day, Take mucinex or mucinex DM ever 12 hours with a full glass of water, you may use an inhaled steroid such as Flonase, 2 sprays each nostril once a day for congestion, or an antihistamine such as Claritin or Zyrtec once a day. For cough, I have prescribed a medication called Tessalon. Take 1 tablet every 8 hours as needed for your cough. For runny nose, prescribed ipratropium nasal spray, 2 sprays each nostril up to 4 times a day as needed. Should your symptoms worsen or fail to resolve, follow up with your primary care provider or return to clinic.

## 2016-04-19 NOTE — ED Provider Notes (Signed)
CSN: [id number]     Arrival date & time 04/19/16  1836 History   First MD Initiated Contact with Patient 04/19/16 2031     Chief Complaint  Patient presents with  . Generalized Body Aches  . Nasal Congestion   (Consider location/radiation/quality/duration/timing/severity/associated sxs/prior Treatment) 61 year old female patient presents with chief complaint of chills, low-grade fever, congestion and runny nose, sore throat, she denies fatigue, denies appetite change, denies nausea, vomiting, diarrhea, or abdominal pain. Symptoms have been ongoing for 2-3 days. 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   The history is provided by the patient.    Past Medical History:  Diagnosis Date  . Dysrhythmia    palpitations  . Hypertension   . MVP (mitral valve prolapse)    Past Surgical History:  Procedure Laterality Date  . CESAREAN SECTION    . DIAGNOSTIC LAPAROSCOPY    . LAPAROSCOPIC ASSISTED VAGINAL HYSTERECTOMY N/A 12/26/2013   Procedure: LAPAROSCOPIC ASSISTED VAGINAL HYSTERECTOMY;  Surgeon: Sharon SellerJennifer M Ozan, DO;  Location: [facility];  Service: Gynecology;  Laterality: N/A;   History reviewed. No pertinent family history. Social History  Substance Use Topics  . Smoking status: Never Smoker  . Smokeless tobacco: Not on file  . Alcohol use Yes     Comment: daily   OB History    No data available     Review of Systems  Reason unable to perform ROS: as covered in HPI.  All other systems reviewed and are negative.   Allergies  Adhesive [tape]  Home Medications   Prior to Admission medications   Medication Sig Start Date End Date Taking? Authorizing Provider  acetaminophen (TYLENOL) 500 MG tablet Take 1,000 mg by mouth every 6 (six) hours as needed.   Yes Historical Provider, MD  amLODipine (NORVASC) 5 MG tablet Take 5 mg by mouth daily.   Yes Historical Provider, MD  buPROPion  (WELLBUTRIN) 75 MG tablet Take 50 mg by mouth 2 (two) times daily.   Yes Historical Provider, MD  benzonatate (TESSALON) 100 MG capsule Take 1 capsule (100 mg total) by mouth every 8 (eight) hours. 04/19/16   Dorena BodoLawrence Hayzen Lorenson, NP  ipratropium (ATROVENT) 0.06 % nasal spray Place 2 sprays into both nostrils 4 (four) times daily. 04/19/16   Dorena BodoLawrence Shemeka Wardle, NP   Meds Ordered and Administered this Visit  Medications - No data to display  BP 125/77 (BP Location: Right Arm)   Pulse 106   Temp 99.8 F (37.7 C) (Oral)   Resp 18   SpO2 98%  No data found.   Physical Exam  Constitutional: She is oriented to person, place, and time. She appears well-developed and well-nourished. She does not have a sickly appearance. She does not appear ill. No distress.  HENT:  Head: Normocephalic and atraumatic.  Right Ear: Tympanic membrane and external ear normal.  Left Ear: Tympanic membrane and external ear normal.  Nose: Rhinorrhea present. Right sinus exhibits no maxillary sinus tenderness and no frontal sinus tenderness. Left sinus exhibits no maxillary sinus tenderness and no frontal sinus tenderness.  Mouth/Throat: Uvula is midline and oropharynx is clear and moist. No oropharyngeal exudate.  Eyes: Pupils are equal, round, and reactive to light.  Neck: Normal range of motion. Neck supple. No JVD present.  Cardiovascular: Normal rate and regular rhythm.   Pulmonary/Chest: Effort normal and breath sounds normal. No respiratory distress. She has no wheezes.  Abdominal: Soft. Bowel  sounds are normal. She exhibits no distension. There is no tenderness. There is no guarding.  Lymphadenopathy:       Head (right side): No submental, no submandibular, no tonsillar and no preauricular adenopathy present.       Head (left side): No submental, no submandibular, no tonsillar and no preauricular adenopathy present.    She has no cervical adenopathy.  Neurological: She is alert and oriented to person, place, and  time.  Skin: Skin is warm and dry. Capillary refill takes less than 2 seconds. She is not diaphoretic.  Psychiatric: She has a normal mood and affect.  Nursing note and vitals reviewed.   Urgent Care Course     Procedures (including critical care time)  Labs Review Labs Reviewed - No data to display  Imaging Review No results found.   Visual Acuity Review  Right Eye Distance:   Left Eye Distance:   Bilateral Distance:    Right Eye Near:   Left Eye Near:    Bilateral Near:         MDM   1. Viral URI with cough    You most likely have a viral URI, I advise rest, plenty of fluids and management of symptoms with over the counter medicines. For symptoms you may take Tylenol as needed every 4-6 hours for body aches or fever, not to exceed 4,000 mg a day, Take mucinex or mucinex DM ever 12 hours with a full glass of water, you may use an inhaled steroid such as Flonase, 2 sprays each nostril once a day for congestion, or an antihistamine such as Claritin or Zyrtec once a day. For cough, I have prescribed a medication called Tessalon. Take 1 tablet every 8 hours as needed for your cough. For runny nose, prescribed ipratropium nasal spray, 2 sprays each nostril up to 4 times a day as needed. Should your symptoms worsen or fail to resolve, follow up with your primary care provider or return to clinic.      Dorena Bodo, NP 04/19/16 2105

## 2016-05-12 ENCOUNTER — Ambulatory Visit (INDEPENDENT_AMBULATORY_CARE_PROVIDER_SITE_OTHER): Payer: BLUE CROSS/BLUE SHIELD | Admitting: Psychology

## 2016-05-12 DIAGNOSIS — F4323 Adjustment disorder with mixed anxiety and depressed mood: Secondary | ICD-10-CM | POA: Diagnosis not present

## 2016-05-27 ENCOUNTER — Encounter (HOSPITAL_COMMUNITY): Payer: Self-pay | Admitting: Family Medicine

## 2016-05-27 ENCOUNTER — Ambulatory Visit (HOSPITAL_COMMUNITY)
Admission: EM | Admit: 2016-05-27 | Discharge: 2016-05-27 | Disposition: A | Discharge status: Home / Self Care | Payer: BLUE CROSS/BLUE SHIELD | Attending: Family Medicine | Admitting: Family Medicine

## 2016-05-27 DIAGNOSIS — H10022 Other mucopurulent conjunctivitis, left eye: Secondary | ICD-10-CM

## 2016-05-27 MED ORDER — POLYMYXIN B-TRIMETHOPRIM 10000-0.1 UNIT/ML-% OP SOLN: 2.0000 [drp] | OPHTHALMIC | 0 refills | Status: DC

## 2016-05-27 NOTE — ED Provider Notes (Signed)
CSN: [id number]     Arrival date & time 05/27/16  1008 History   None    Chief Complaint  Patient presents with  . Conjunctivitis   (Consider location/radiation/quality/duration/timing/severity/associated sxs/prior Treatment) Patient c/o left eye redness and drainage.   The history is provided by the patient.  Conjunctivitis  This is a new problem. The problem occurs constantly. The problem has not changed since onset.Nothing aggravates the symptoms. Nothing relieves the symptoms.    Past Medical History:  Diagnosis Date  . Dysrhythmia    palpitations  . Hypertension   . MVP (mitral valve prolapse)    Past Surgical History:  Procedure Laterality Date  . CESAREAN SECTION    . DIAGNOSTIC LAPAROSCOPY    . LAPAROSCOPIC ASSISTED VAGINAL HYSTERECTOMY N/A 12/26/2013   Procedure: LAPAROSCOPIC ASSISTED VAGINAL HYSTERECTOMY;  Surgeon: Sharon SellerJennifer M Ozan, DO;  Location: [facility];  Service: Gynecology;  Laterality: N/A;   History reviewed. No pertinent family history. Social History  Substance Use Topics  . Smoking status: Never Smoker  . Smokeless tobacco: Never Used  . Alcohol use Yes     Comment: daily   OB History    No data available     Review of Systems  Constitutional: Negative.   HENT: Negative.   Eyes: Positive for discharge, redness and itching.  Respiratory: Negative.   Cardiovascular: Negative.   Gastrointestinal: Negative.   Endocrine: Negative.   Genitourinary: Negative.   Musculoskeletal: Negative.   Allergic/Immunologic: Negative.   Neurological: Negative.   Hematological: Negative.   Psychiatric/Behavioral: Negative.     Allergies  Adhesive [tape]  Home Medications   Prior to Admission medications   Medication Sig Start Date End Date Taking? Authorizing Provider  acetaminophen (TYLENOL) 500 MG tablet Take 1,000 mg by mouth every 6 (six) hours as needed.    Historical Provider, MD  amLODipine (NORVASC) 5 MG tablet Take 5 mg by mouth daily.     Historical Provider, MD  benzonatate (TESSALON) 100 MG capsule Take 1 capsule (100 mg total) by mouth every 8 (eight) hours. 04/19/16   Dorena BodoLawrence Kennard, NP  buPROPion (WELLBUTRIN) 75 MG tablet Take 50 mg by mouth 2 (two) times daily.    Historical Provider, MD  ipratropium (ATROVENT) 0.06 % nasal spray Place 2 sprays into both nostrils 4 (four) times daily. 04/19/16   Dorena BodoLawrence Kennard, NP  trimethoprim-polymyxin b (POLYTRIM) ophthalmic solution Place 2 drops into the left eye every 4 (four) hours. 05/27/16   Deatra CanterWilliam J Ryllie Nieland, FNP   Meds Ordered and Administered this Visit  Medications - No data to display  BP 126/73 (BP Location: Left Arm)   Pulse 85   Temp 98.2 F (36.8 C) (Oral)   Resp 12   SpO2 100%  No data found.   Physical Exam  Constitutional: She appears well-developed and well-nourished.  HENT:  Head: Normocephalic and atraumatic.  Eyes: EOM are normal. Pupils are equal, round, and reactive to light.  Left conjunctiva injected  Neck: Normal range of motion. Neck supple.  Cardiovascular: Normal rate, regular rhythm and normal heart sounds.   Pulmonary/Chest: Effort normal and breath sounds normal.  Nursing note and vitals reviewed.   Urgent Care Course     Procedures (including critical care time)  Labs Review Labs Reviewed - No data to display  Imaging Review No results found.   Visual Acuity Review  Right Eye Distance:   Left Eye Distance:   Bilateral Distance:    Right Eye Near:   Left Eye Near:  Bilateral Near:         MDM   1. Other mucopurulent conjunctivitis of left eye    polytrim eye gtt's 2 gtt's OS q 4hours #53ml    Deatra Canter, FNP 05/27/16 1150

## 2016-05-27 NOTE — ED Triage Notes (Signed)
Pt here for redness to left eye with drainage. 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

## 2016-08-16 ENCOUNTER — Ambulatory Visit (INDEPENDENT_AMBULATORY_CARE_PROVIDER_SITE_OTHER): Payer: BLUE CROSS/BLUE SHIELD | Admitting: Psychology

## 2016-08-16 DIAGNOSIS — F4323 Adjustment disorder with mixed anxiety and depressed mood: Secondary | ICD-10-CM | POA: Diagnosis not present

## 2016-08-19 IMAGING — CR DG CHEST 2V
2 series · 2 of 2 positions shown · non-contrast
Comparison: None.

CLINICAL DATA: 59-year-old female with productive cough and
posterior nasal drip for 5 weeks. Initial encounter. Nonsmoker.

EXAM:
CHEST  2 VIEW

[w chest pa]
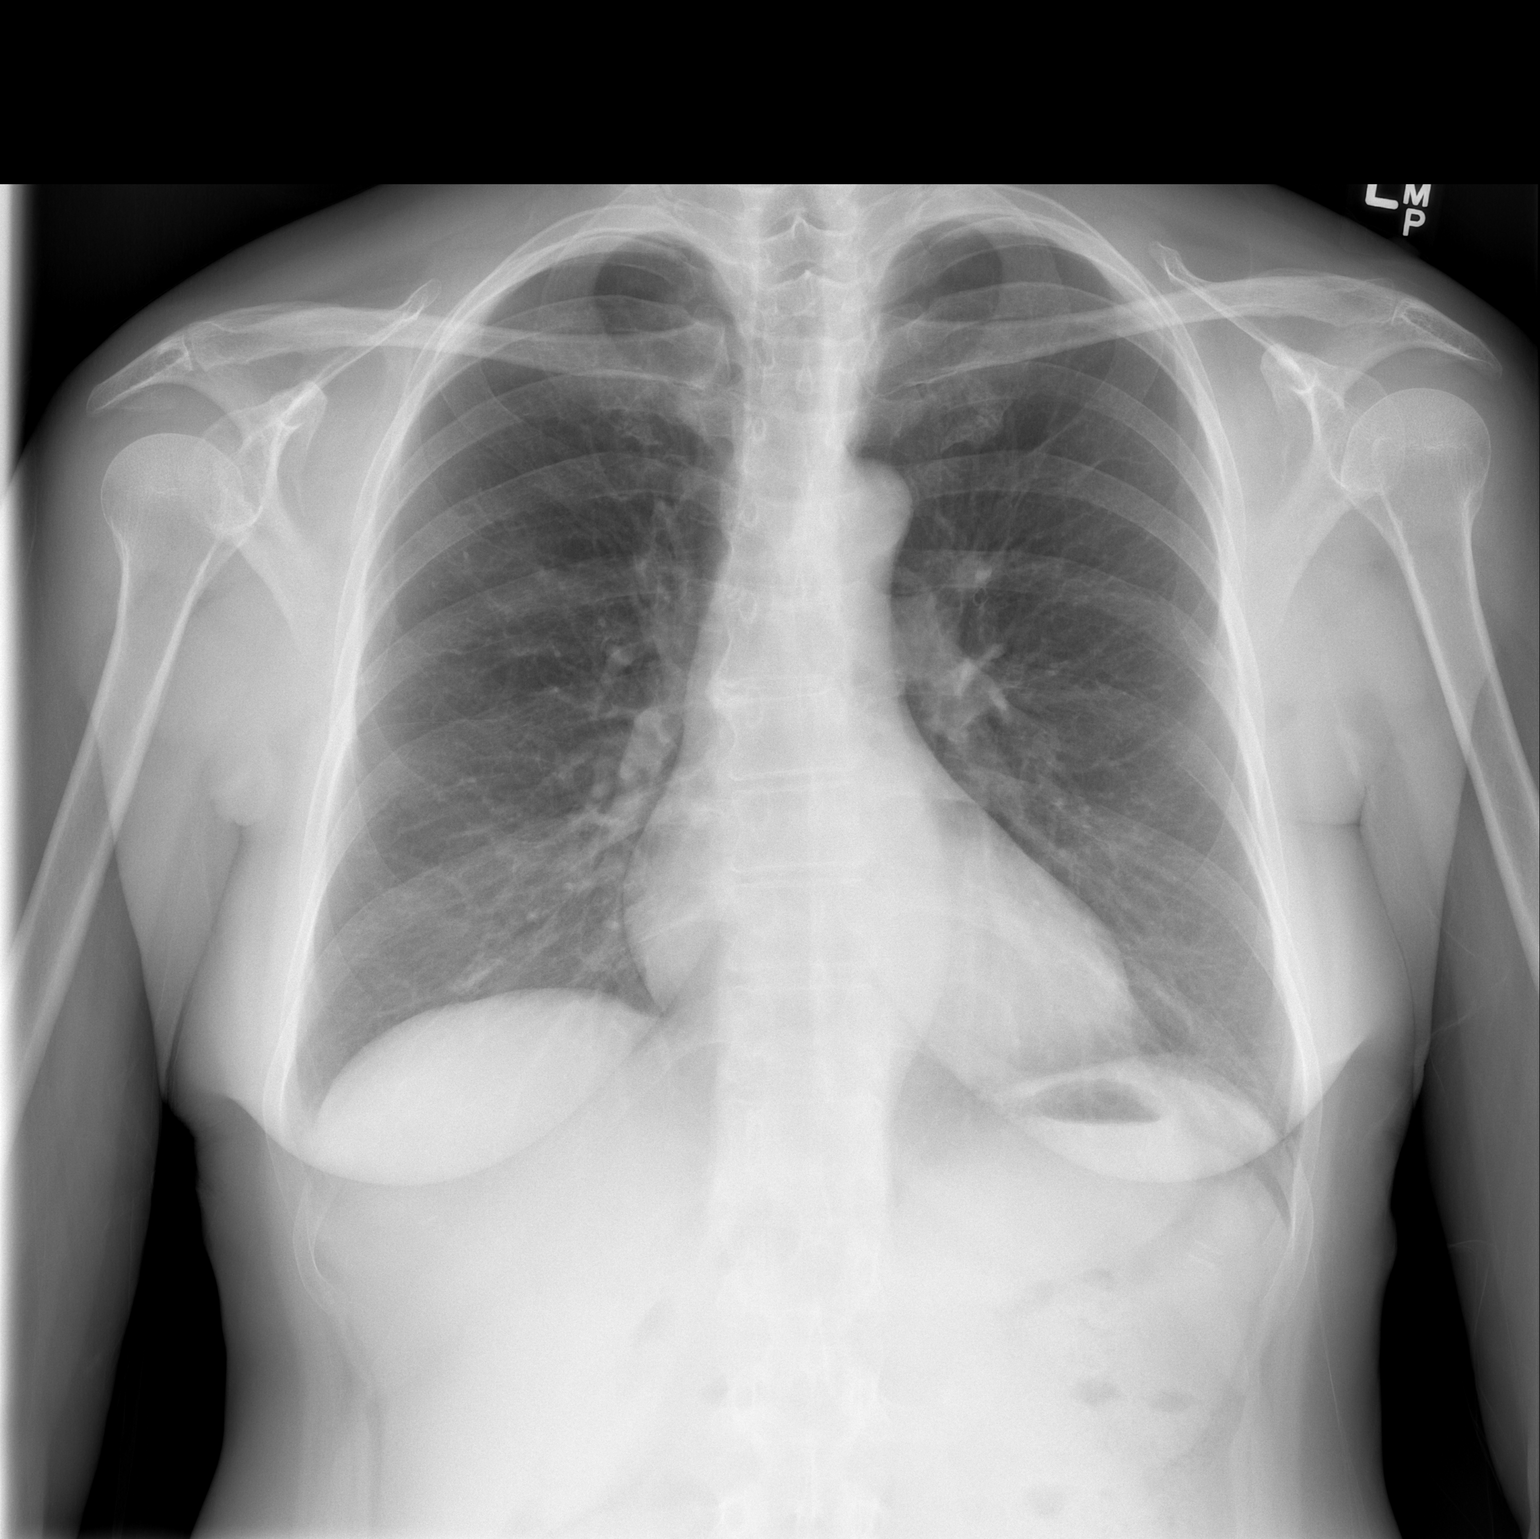

[w chest lat]
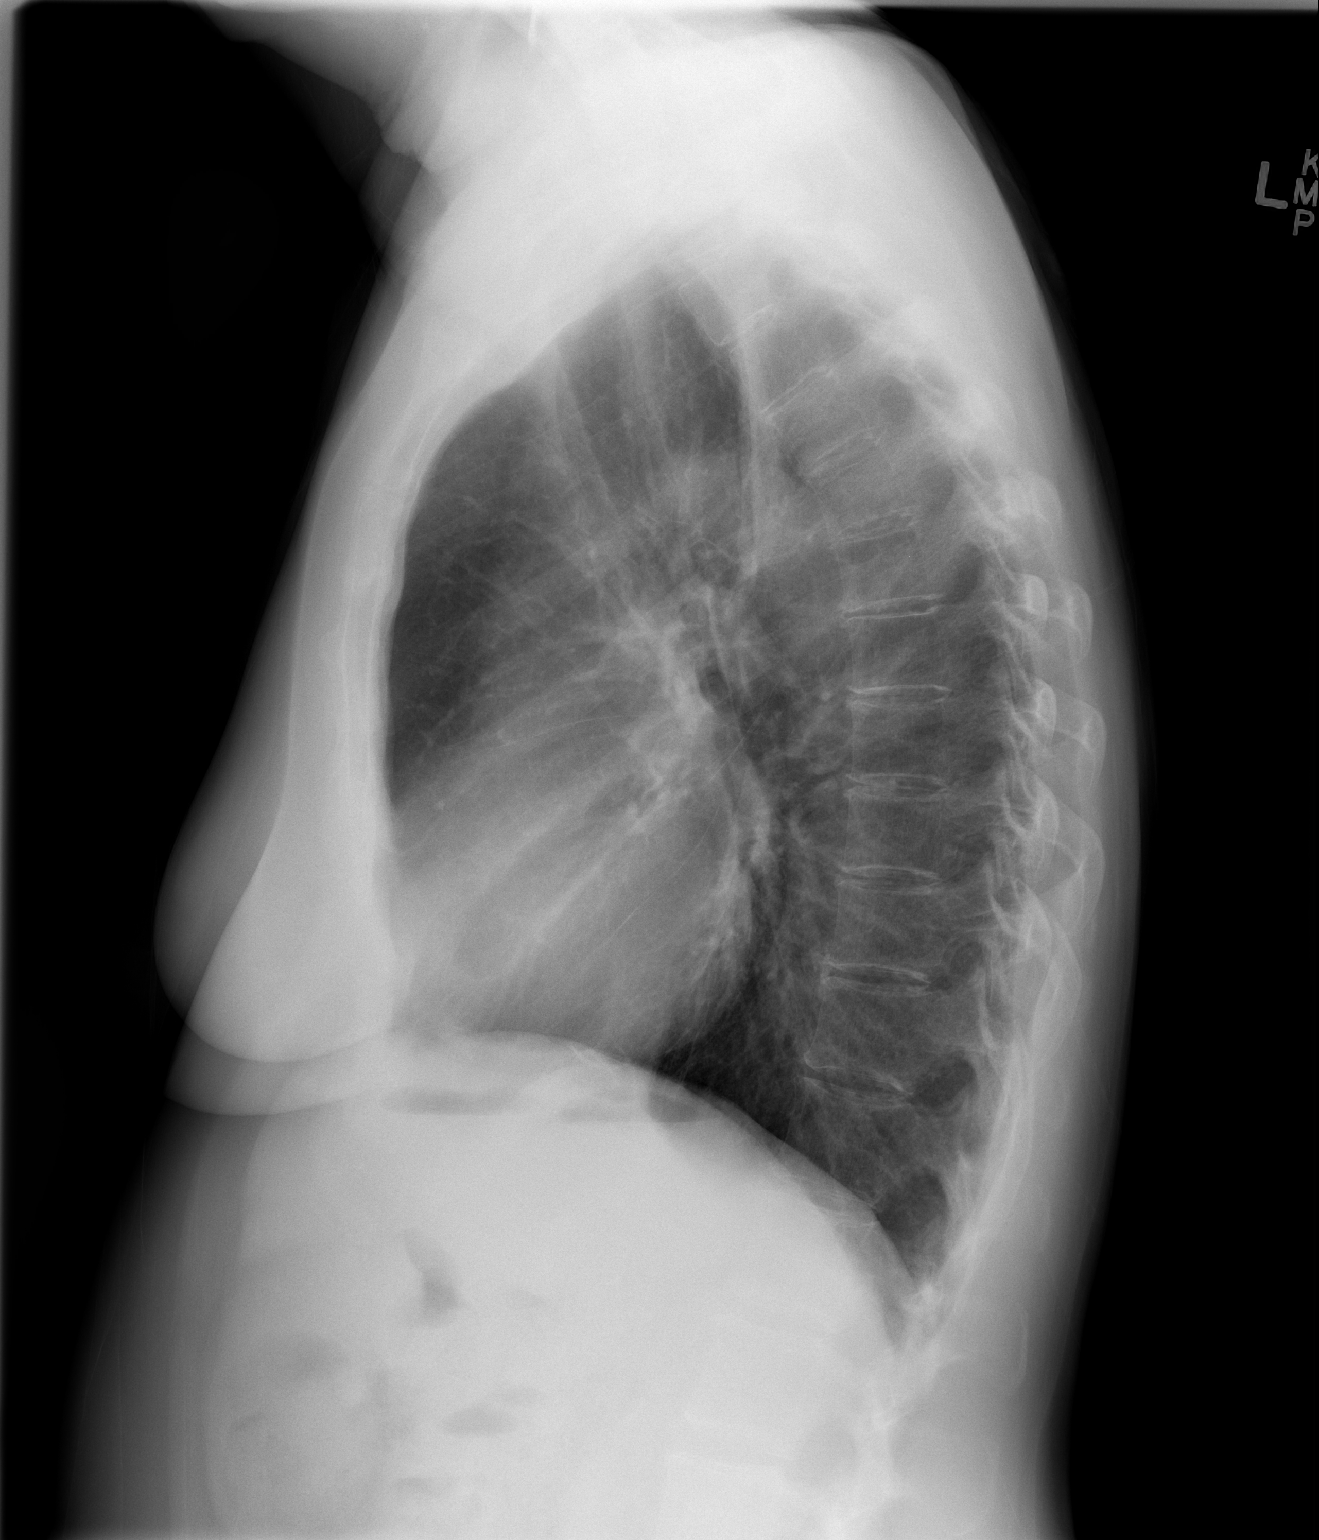

[2 of 2 positions shown; findings below may reference images not displayed]

FINDINGS: Normal lung volumes. Normal cardiac size and mediastinal contours.
Visualized tracheal air column is within normal limits. No
pneumothorax, pulmonary edema, pleural effusion or confluent
pulmonary opacity. No acute osseous abnormality identified.
IMPRESSION: Negative, no acute cardiopulmonary abnormality.

## 2017-02-17 ENCOUNTER — Encounter: Payer: Self-pay | Admitting: Family Medicine

## 2017-02-17 ENCOUNTER — Ambulatory Visit (INDEPENDENT_AMBULATORY_CARE_PROVIDER_SITE_OTHER): Payer: BLUE CROSS/BLUE SHIELD | Admitting: Family Medicine

## 2017-02-17 VITALS — BP 118/68 | HR 84 | Temp 98.3°F | Ht 63.0 in | Wt 135.4 lb

## 2017-02-17 DIAGNOSIS — Z1159 Encounter for screening for other viral diseases: Secondary | ICD-10-CM | POA: Diagnosis not present

## 2017-02-17 DIAGNOSIS — I10 Essential (primary) hypertension: Secondary | ICD-10-CM | POA: Insufficient documentation

## 2017-02-17 DIAGNOSIS — Z23 Encounter for immunization: Secondary | ICD-10-CM | POA: Diagnosis not present

## 2017-02-17 DIAGNOSIS — Z13220 Encounter for screening for lipoid disorders: Secondary | ICD-10-CM

## 2017-02-17 DIAGNOSIS — F325 Major depressive disorder, single episode, in full remission: Secondary | ICD-10-CM

## 2017-02-17 NOTE — Progress Notes (Signed)
Subjective:  Diana Case is a 61 y.o. female who presents today with a chief complaint of hypertension and to establish care.   HPI:  Hypertension, new problem Several year history.  Has been on amlodipine 5 mg daily for several years and has been stable on this dose.  No noted side effects.  Has never been on any other medications.  Has been trying to exercise more.  Has been trying to eat healthy.  No obvious alleviating or aggravating factors.  Depression, new problem Several year history.  Has been stable on Wellbutrin 50 mg twice daily for several years.  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  No SI or HI.  ROS: Per HPI, otherwise a 14 point review of systems was performed and was negative  PMH:  The following were reviewed and entered/updated in epic: Past Medical History:  Diagnosis Date  . Dysrhythmia    palpitations  . Hypertension   . MVP (mitral valve prolapse)    Patient Active Problem List   Diagnosis Date Noted  . Essential hypertension 02/17/2017  . Depression, major, single episode, complete remission (HCC) 02/17/2017  . Postmenopausal bleeding 12/26/2013   Past Surgical History:  Procedure Laterality Date  . CESAREAN SECTION    . DIAGNOSTIC LAPAROSCOPY    . LAPAROSCOPIC ASSISTED VAGINAL HYSTERECTOMY N/A 12/26/2013   Procedure: LAPAROSCOPIC ASSISTED VAGINAL HYSTERECTOMY;  Surgeon: Sharon SellerJennifer M Ozan, DO;  Location: [facility];  Service: Gynecology;  Laterality: N/A;   History reviewed. No pertinent family history.  Medications- reviewed and updated Current Outpatient Medications  Medication Sig Dispense Refill  . acetaminophen (TYLENOL) 500 MG tablet Take 1,000 mg by mouth every 6 (six) hours as needed.    Marland Kitchen. amLODipine (NORVASC) 5 MG tablet Take 5 mg by mouth daily.    Marland Kitchen. buPROPion (WELLBUTRIN) 75 MG tablet Take 50 mg by mouth 2 (two) times daily.     No current  facility-administered medications for this visit.     Allergies-reviewed and updated Allergies  Allergen Reactions  . Adhesive [Tape] Rash    Social History   Socioeconomic History  . Marital status: Married    Spouse name: None  . Number of children: None  . Years of education: None  . Highest education level: None  Social Needs  . Financial resource strain: None  . Food insecurity - worry: None  . Food insecurity - inability: None  . Transportation needs - medical: None  . Transportation needs - non-medical: None  Occupational History  . None  Tobacco Use  . Smoking status: Never Smoker  . Smokeless tobacco: Never Used  Substance and Sexual Activity  . Alcohol use: Yes    Comment: daily  . Drug use: No  . Sexual activity: None  Other Topics Concern  . None  Social History Narrative  . None   Objective:  Physical Exam: BP 118/68 (BP Location: Left Arm, Patient Position: Sitting, Cuff Size: Normal)   Pulse 84   Temp 98.3 F (36.8 C) (Oral)   Ht 5\' 3"  (1.6 m)   Wt 135 lb 6.4 oz (61.4 kg)   SpO2 98%   BMI 23.99 kg/m   Gen: NAD, resting comfortably CV: RRR with no murmurs appreciated Pulm: NWOB, CTAB with no crackles, wheezes, or rhonchi GI: Normal bowel sounds present. Soft, Nontender, Nondistended. MSK: No edema, cyanosis, or clubbing noted Skin: Warm, dry Neuro: Grossly normal, moves all  extremities Psych: Normal affect and thought content  Assessment/Plan:  Essential hypertension At goal per JNC 8 guidelines.  Continue amlodipine 5 mg daily.  Check CMET.  Follow-up in 1 year.  Advised continue home blood pressure monitoring with goal 140/90 or less.  Depression, major, single episode, complete remission (HCC) Well-controlled.  No change today.  Continue Wellbutrin 50 mg twice daily.   Preventative healthcare Check hep C antibody.  Check lipid panel.  Flu shot given today.  Patient reports that she is up-to-date on her colon cancer screening-we will  obtain these records from prior PCP.  Katina Degreealeb M. Jimmey RalphParker, MD 02/17/2017 12:19 PM

## 2017-02-17 NOTE — Assessment & Plan Note (Signed)
At goal per JNC 8 guidelines.  Continue amlodipine 5 mg daily.  Check CMET.  Follow-up in 1 year.  Advised continue home blood pressure monitoring with goal 140/90 or less.

## 2017-02-17 NOTE — Assessment & Plan Note (Signed)
Well-controlled.  No change today.  Continue Wellbutrin 50 mg twice daily.

## 2017-02-17 NOTE — Patient Instructions (Signed)
No changes today.  Come back tomorrow for fasting blood work.  Take care,  Dr Jimmey RalphParker

## 2017-02-18 ENCOUNTER — Other Ambulatory Visit (INDEPENDENT_AMBULATORY_CARE_PROVIDER_SITE_OTHER): Payer: BLUE CROSS/BLUE SHIELD

## 2017-02-18 DIAGNOSIS — I10 Essential (primary) hypertension: Secondary | ICD-10-CM

## 2017-02-18 DIAGNOSIS — Z13220 Encounter for screening for lipoid disorders: Secondary | ICD-10-CM | POA: Diagnosis not present

## 2017-02-18 DIAGNOSIS — Z1159 Encounter for screening for other viral diseases: Secondary | ICD-10-CM | POA: Diagnosis not present

## 2017-02-18 LAB — COMPREHENSIVE METABOLIC PANEL
ALT: 19 U/L (ref 0–35)
AST: 21 U/L (ref 0–37)
Albumin: 4.5 g/dL (ref 3.5–5.2)
Alkaline Phosphatase: 55 U/L (ref 39–117)
BILIRUBIN TOTAL: 0.5 mg/dL (ref 0.2–1.2)
BUN: 13 mg/dL (ref 6–23)
CO2: 27 meq/L (ref 19–32)
CREATININE: 0.51 mg/dL (ref 0.40–1.20)
Calcium: 9.3 mg/dL (ref 8.4–10.5)
Chloride: 100 mEq/L (ref 96–112)
GFR: 129.08 mL/min (ref >60.00)
GLUCOSE: 86 mg/dL (ref 70–99)
Potassium: 4.5 mEq/L (ref 3.5–5.1)
SODIUM: 137 meq/L (ref 135–145)
Total Protein: 7.7 g/dL (ref 6.0–8.3)

## 2017-02-18 LAB — CBC
HCT: 40.9 % (ref 36.0–46.0)
Hemoglobin: 13.5 g/dL (ref 12.0–15.0)
MCHC: 33.1 g/dL (ref 30.0–36.0)
MCV: 95.6 fl (ref 78.0–100.0)
Platelets: 300 10*3/uL (ref 150.0–400.0)
RBC: 4.28 Mil/uL (ref 3.87–5.11)
RDW: 13.5 % (ref 11.5–15.5)
WBC: 5.2 10*3/uL (ref 4.0–10.5)

## 2017-02-18 LAB — LIPID PANEL
CHOL/HDL RATIO: 3
Cholesterol: 273 mg/dL — ABNORMAL HIGH (ref 0–200)
HDL: 108.4 mg/dL (ref >39.00)
LDL CALC: 144 mg/dL — AB (ref 0–99)
NONHDL: 165.04
Triglycerides: 104 mg/dL (ref 0.0–149.0)
VLDL: 20.8 mg/dL (ref 0.0–40.0)

## 2017-02-19 LAB — HEPATITIS C ANTIBODY
Hepatitis C Ab: NONREACTIVE
SIGNAL TO CUT-OFF: 0.01 (ref <1.00)

## 2017-02-21 ENCOUNTER — Telehealth: Payer: Self-pay

## 2017-02-21 NOTE — Telephone Encounter (Signed)
Insurance form filled out and faxed on patient's behalf.  Copy mailed to patient and sent for scanning.

## 2017-02-21 NOTE — Progress Notes (Signed)
Patient's cholesterol is elevated. No need to start meds - patient should continue working on healthy diet and regular exercise. We can recheck again next year.  All of her other labs are normal.  Katina Degreealeb M. Jimmey RalphParker, MD 02/21/2017 8:18 AM

## 2017-03-11 ENCOUNTER — Other Ambulatory Visit: Payer: Self-pay

## 2017-03-11 ENCOUNTER — Telehealth: Payer: Self-pay | Admitting: Family Medicine

## 2017-03-11 MED ORDER — AMLODIPINE BESYLATE 5 MG PO TABS: 5.0000 mg | ORAL_TABLET | Freq: Every day | ORAL | 1 refills | Status: DC

## 2017-03-11 NOTE — Telephone Encounter (Signed)
Rx has been sent to patient's pharmacy. 

## 2017-03-11 NOTE — Telephone Encounter (Signed)
Ok with me. Please place any necessary orders. 

## 2017-03-11 NOTE — Telephone Encounter (Signed)
Copied from CRM [phone]#[id number]. Topic: Quick Communication - See Telephone Encounter >> Mar 11, 2017  9:52 AM Windy KalataMichael, Osborn Pullin L, NT wrote: CRM for notification. See Telephone encounter for:  03/11/17.  [facility] is [facility] Amlod[facility]ine 5mg . Pt is a new patient of Dr. Jimmey RalphParker and has never been called in from him before.

## 2017-05-20 ENCOUNTER — Ambulatory Visit (INDEPENDENT_AMBULATORY_CARE_PROVIDER_SITE_OTHER): Payer: BLUE CROSS/BLUE SHIELD | Admitting: Family Medicine

## 2017-05-20 ENCOUNTER — Ambulatory Visit (INDEPENDENT_AMBULATORY_CARE_PROVIDER_SITE_OTHER): Payer: BLUE CROSS/BLUE SHIELD

## 2017-05-20 ENCOUNTER — Encounter: Payer: Self-pay | Admitting: Family Medicine

## 2017-05-20 VITALS — HR 90 | Temp 98.5°F | Ht 63.0 in | Wt 138.5 lb

## 2017-05-20 DIAGNOSIS — M25562 Pain in left knee: Secondary | ICD-10-CM

## 2017-05-20 DIAGNOSIS — G8929 Other chronic pain: Secondary | ICD-10-CM | POA: Diagnosis not present

## 2017-05-20 MED ORDER — DICLOFENAC SODIUM 75 MG PO TBEC: 75.0000 mg | DELAYED_RELEASE_TABLET | Freq: Two times a day (BID) | ORAL | 0 refills | Status: DC

## 2017-05-20 NOTE — Progress Notes (Signed)
    Subjective:  Diana Case is a 62 y.o. female who presents today for same-day appointment with a chief complaint of knee pain.   HPI:  Left Knee Pain, New Problem Symptoms started about 5 months ago.  Patient thinks that symptoms started after she did a a lot of going up and down a ladder while painting her house.  Since then she has had pain in both of her knees however the pain in her right knee has essentially resolved.  Pain mostly located along the anterior and lateral aspect of her knee.  Pain is worse with movement and worse with bending.  Occasionally has locking and popping in her knee.  She has tried taking Tylenol which helps a little bit.  She is also infrequently uses knee brace, but she is not sure if it helped.  No other obvious alleviating or aggravating factors.  ROS: Per HPI  PMH: She reports that she has never smoked. She has never used smokeless tobacco. She reports that she drinks alcohol. She reports that she does not use drugs.  Objective:  Physical Exam: Pulse 90   Temp 98.5 F (36.9 C)   Ht 5\' 3"  (1.6 m)   Wt 138 lb 8 oz (62.8 kg)   SpO2 96%   BMI 24.53 kg/m   Gen: NAD, resting comfortably MSK: -Left knee: No deformities.  Mildly tender to palpation along medial joint line.  Crepitus with active range of motion.  Stable to varus and valgus stress.  Anterior and posterior drawer signs negative.  Pain elicited with both McMurray and Thessaly test.  Neurovascularly intact distally.  Straight leg raise negative on the left.  Assessment/Plan:  Chronic pain of left knee Likely degenerative changes with degenerative meniscal tear.  She has a little bit of degenerative change based on her plain film today without other obvious bony abnormality-we will await radiology read.  No red flag signs or symptoms.  Discussed conservative treatment with patient.  Recommended ice as needed.  Discussed home exercise program focusing on strengthening the musculature.  She  will also use compression as tolerated.  I will send in diclofenac for use as needed for pain and swelling.  Consider PT referral if symptoms not improving with above.  May eventually need intra-articular knee injection.  Follow-up as needed.  Katina Degreealeb M. Jimmey RalphParker, MD 05/20/2017 4:07 PM

## 2017-05-20 NOTE — Patient Instructions (Signed)
You have very mild arthritis and probably a small meniscal tear.   Try the exercises.  Use the diclofenac as needed.  If not improving, let me know and we can get you in to see our physical therapist.  Take care, Dr Jimmey RalphParker

## 2017-05-20 NOTE — Assessment & Plan Note (Addendum)
Likely degenerative changes with degenerative meniscal tear.  She has a little bit of degenerative change based on her plain film today without other obvious bony abnormality-we will await radiology read.  No red flag signs or symptoms.  Discussed conservative treatment with patient.  Recommended ice as needed.  Discussed home exercise program focusing on strengthening the musculature.  She will also use compression as tolerated.  I will send in diclofenac for use as needed for pain and swelling.  Consider PT referral if symptoms not improving with above.  May eventually need intra-articular knee injection.  Follow-up as needed.

## 2017-06-01 ENCOUNTER — Other Ambulatory Visit: Payer: Self-pay | Admitting: Family Medicine

## 2017-06-30 ENCOUNTER — Other Ambulatory Visit: Payer: Self-pay | Admitting: Family Medicine

## 2017-06-30 ENCOUNTER — Telehealth: Payer: Self-pay | Admitting: Family Medicine

## 2017-06-30 NOTE — Telephone Encounter (Signed)
Copied from CRM [id number] [id number]. Topic: Quick Communication - Rx Refill/Question >> Jun 30, 2017  9:08 AM Rudi Coco, NT wrote: Medication: diclofenac (VOLTAREN) 75 MG EC tablet [[id number]]  Has the patient contacted their pharmacy? no (Agent: If no, request that the patient contact the pharmacy for the refill.) Preferred Pharmacy (with phone number or street name): [id number] [place] [place] - [address] [facility] [address] [place] [address] Phone: [phone] Fax: [phone]   Agent: Please be advised that RX refills may take up to 3 business days. We ask that you follow-up with your pharmacy.

## 2017-09-02 ENCOUNTER — Other Ambulatory Visit: Payer: Self-pay | Admitting: Family Medicine

## 2017-10-06 LAB — HM MAMMOGRAPHY

## 2017-12-21 ENCOUNTER — Ambulatory Visit: Payer: BLUE CROSS/BLUE SHIELD | Admitting: Family Medicine

## 2017-12-23 ENCOUNTER — Encounter: Payer: Self-pay | Admitting: Family Medicine

## 2017-12-23 ENCOUNTER — Ambulatory Visit (INDEPENDENT_AMBULATORY_CARE_PROVIDER_SITE_OTHER): Payer: BLUE CROSS/BLUE SHIELD | Admitting: Family Medicine

## 2017-12-23 VITALS — BP 122/78 | HR 91 | Temp 97.7°F | Ht 63.0 in | Wt 132.8 lb

## 2017-12-23 DIAGNOSIS — Z13220 Encounter for screening for lipoid disorders: Secondary | ICD-10-CM | POA: Diagnosis not present

## 2017-12-23 DIAGNOSIS — M25562 Pain in left knee: Secondary | ICD-10-CM | POA: Diagnosis not present

## 2017-12-23 DIAGNOSIS — Z1211 Encounter for screening for malignant neoplasm of colon: Secondary | ICD-10-CM | POA: Diagnosis not present

## 2017-12-23 DIAGNOSIS — F325 Major depressive disorder, single episode, in full remission: Secondary | ICD-10-CM | POA: Diagnosis not present

## 2017-12-23 DIAGNOSIS — Z0001 Encounter for general adult medical examination with abnormal findings: Secondary | ICD-10-CM | POA: Diagnosis not present

## 2017-12-23 DIAGNOSIS — G8929 Other chronic pain: Secondary | ICD-10-CM

## 2017-12-23 DIAGNOSIS — I10 Essential (primary) hypertension: Secondary | ICD-10-CM | POA: Diagnosis not present

## 2017-12-23 DIAGNOSIS — Z23 Encounter for immunization: Secondary | ICD-10-CM | POA: Diagnosis not present

## 2017-12-23 LAB — CBC
HCT: 41.3 % (ref 36.0–46.0)
HEMOGLOBIN: 14.1 g/dL (ref 12.0–15.0)
MCHC: 34.2 g/dL (ref 30.0–36.0)
MCV: 93.3 fl (ref 78.0–100.0)
PLATELETS: 270 10*3/uL (ref 150.0–400.0)
RBC: 4.43 Mil/uL (ref 3.87–5.11)
RDW: 13.7 % (ref 11.5–15.5)
WBC: 5.1 10*3/uL (ref 4.0–10.5)

## 2017-12-23 LAB — COMPREHENSIVE METABOLIC PANEL
ALBUMIN: 4.6 g/dL (ref 3.5–5.2)
ALK PHOS: 54 U/L (ref 39–117)
ALT: 20 U/L (ref 0–35)
AST: 20 U/L (ref 0–37)
BILIRUBIN TOTAL: 0.5 mg/dL (ref 0.2–1.2)
BUN: 8 mg/dL (ref 6–23)
CALCIUM: 9.7 mg/dL (ref 8.4–10.5)
CO2: 26 mEq/L (ref 19–32)
Chloride: 101 mEq/L (ref 96–112)
Creatinine, Ser: 0.48 mg/dL (ref 0.40–1.20)
GFR: 138.06 mL/min (ref >60.00)
Glucose, Bld: 86 mg/dL (ref 70–99)
Potassium: 4.1 mEq/L (ref 3.5–5.1)
Sodium: 138 mEq/L (ref 135–145)
Total Protein: 7.8 g/dL (ref 6.0–8.3)

## 2017-12-23 LAB — LIPID PANEL
Cholesterol: 282 mg/dL — ABNORMAL HIGH (ref 0–200)
HDL: 111.1 mg/dL (ref >39.00)
LDL Cholesterol: 148 mg/dL — ABNORMAL HIGH (ref 0–99)
NonHDL: 171.03
TRIGLYCERIDES: 114 mg/dL (ref 0.0–149.0)
Total CHOL/HDL Ratio: 3
VLDL: 22.8 mg/dL (ref 0.0–40.0)

## 2017-12-23 NOTE — Assessment & Plan Note (Signed)
Symptoms are stable on Wellbutrin.  She was previously seeing psychiatry for this, however would like for [place] to take over this prescription.  We agreed to this.  Gave contact info for our in-house therapist as well.

## 2017-12-23 NOTE — Assessment & Plan Note (Signed)
At goal on Norvasc 5 mg daily.  We will continue this.  Check CBC and CMET today.

## 2017-12-23 NOTE — Progress Notes (Signed)
Subjective:  Diana Case is a 62 y.o. female who presents today for her annual comprehensive physical exam.    HPI:  She has no acute complaints today.   1. Depression. Stable on wellbutrin 75mg  daily. No side effects.  2. HTN.  On Norvasc 5 mg daily.  Tolerating well without side effects.  No reported chest pain or shortness of breath.  Lifestyle Diet: Pescatarian diet.  Exercise: Uses stationary bike routinely. Likes to walk.   Depression screen PHQ 2/9 12/23/2017  Decreased Interest 0  Down, Depressed, Hopeless 0  PHQ - 2 Score 0  Altered sleeping -  Tired, decreased energy -  Change in appetite -  Trouble concentrating -  Moving slowly or fidgety/restless -  Suicidal thoughts -  PHQ-9 Score -    Health Maintenance Due  Topic Date Due  . HIV Screening  03/21/1968  . TETANUS/TDAP  03/21/1972  . COLONOSCOPY  03/22/2003  . INFLUENZA VACCINE  09/29/2017     ROS: Per HPI, otherwise a complete review of systems was negative.   PMH:  The following were reviewed and entered/updated in epic: Past Medical History:  Diagnosis Date  . Dysrhythmia    palpitations  . Hypertension   . MVP (mitral valve prolapse)    Patient Active Problem List   Diagnosis Date Noted  . Chronic pain of left knee 05/20/2017  . Essential hypertension 02/17/2017  . Depression, major, single episode, complete remission (HCC) 02/17/2017  . Postmenopausal bleeding 12/26/2013   Past Surgical History:  Procedure Laterality Date  . CESAREAN SECTION    . DIAGNOSTIC LAPAROSCOPY    . LAPAROSCOPIC ASSISTED VAGINAL HYSTERECTOMY N/A 12/26/2013   Procedure: LAPAROSCOPIC ASSISTED VAGINAL HYSTERECTOMY;  Surgeon: Sharon Seller, DO;  Location: [facility];  Service: Gynecology;  Laterality: N/A;    History reviewed. No pertinent family history.  Medications- reviewed and updated Current Outpatient Medications  Medication Sig Dispense Refill  . acetaminophen (TYLENOL) 500 MG tablet Take  1,000 mg by mouth every 6 (six) hours as needed.    Marland Kitchen amLODipine (NORVASC) 5 MG tablet TAKE 1 TABLET BY MOUTH EVERY DAY 90 tablet 1  . buPROPion (WELLBUTRIN) 75 MG tablet Take 50 mg by mouth 2 (two) times daily.    Marland Kitchen ibuprofen (ADVIL,MOTRIN) 200 MG tablet Take 400 mg by mouth as needed.    . naltrexone (DEPADE) 50 MG tablet Take 50 mg by mouth every evening.  2   No current facility-administered medications for this visit.     Allergies-reviewed and updated Allergies  Allergen Reactions  . Adhesive [Tape] Rash    Social History   Socioeconomic History  . Marital status: Married    Spouse name: Not on file  . Number of children: Not on file  . Years of education: Not on file  . Highest education level: Not on file  Occupational History  . Not on file  Social Needs  . Financial resource strain: Not on file  . Food insecurity:    Worry: Not on file    Inability: Not on file  . Transportation needs:    Medical: Not on file    Non-medical: Not on file  Tobacco Use  . Smoking status: Never Smoker  . Smokeless tobacco: Never Used  Substance and Sexual Activity  . Alcohol use: Yes    Comment: daily  . Drug use: No  . Sexual activity: Not on file  Lifestyle  . Physical activity:    Days per week: Not  on file    Minutes per session: Not on file  . Stress: Not on file  Relationships  . Social connections:    Talks on phone: Not on file    Gets together: Not on file    Attends religious service: Not on file    Active member of club or organization: Not on file    Attends meetings of clubs or organizations: Not on file    Relationship status: Not on file  Other Topics Concern  . Not on file  Social History Narrative  . Not on file    Objective:  Physical Exam: BP 122/78 (BP Location: Left Arm, Patient Position: Sitting, Cuff Size: Normal)   Pulse 91   Temp 97.7 F (36.5 C) (Oral)   Ht 5\' 3"  (1.6 m)   Wt 132 lb 12.8 oz (60.2 kg)   SpO2 99%   BMI 23.52 kg/m     Body mass index is 23.52 kg/m. Wt Readings from Last 3 Encounters:  12/23/17 132 lb 12.8 oz (60.2 kg)  05/20/17 138 lb 8 oz (62.8 kg)  02/17/17 135 lb 6.4 oz (61.4 kg)  Gen: NAD, resting comfortably HEENT: TMs normal bilaterally. OP clear. No thyromegaly noted.  CV: RRR with no murmurs appreciated Pulm: NWOB, CTAB with no crackles, wheezes, or rhonchi GI: Normal bowel sounds present. Soft, Nontender, Nondistended. MSK: no edema, cyanosis, or clubbing noted Skin: warm, dry Neuro: CN2-12 grossly intact. Strength 5/5 in upper and lower extremities. Reflexes symmetric and intact bilaterally.  Psych: Normal affect and thought content  Assessment/Plan:  Essential hypertension At goal on Norvasc 5 mg daily.  We will continue this.  Check CBC and CMET today.  Depression, major, single episode, complete remission (HCC) Symptoms are stable on Wellbutrin.  She was previously seeing psychiatry for this, however would like for [place] to take over this prescription.  We agreed to this.  Gave contact info for our in-house therapist as well.   Preventative Healthcare: Check lipid panel, glucose on CMET, and Cologuard.  Flu vaccine given today.  She will get her DEXA scan and mammogram early next year.  Patient Counseling(The following topics were reviewed and/or handout was given):  -Nutrition: Stressed importance of moderation in sodium/caffeine intake, saturated fat and cholesterol, caloric balance, sufficient intake of fresh fruits, vegetables, and fiber.  -Stressed the importance of regular exercise.   -Substance Abuse: Discussed cessation/primary prevention of tobacco, alcohol, or other drug use; driving or other dangerous activities under the influence; availability of treatment for abuse.   -Injury prevention: Discussed safety belts, safety helmets, smoke detector, smoking near bedding or upholstery.   -Sexuality: Discussed sexually transmitted diseases, partner selection, use of condoms,  avoidance of unintended pregnancy and contraceptive alternatives.   -Dental health: Discussed importance of regular tooth brushing, flossing, and dental visits.  -Health maintenance and immunizations reviewed. Please refer to Health maintenance section.  Return to care in 1 year for next preventative visit.   Katina Degree. [name], MD 12/23/2017 9:59 AM

## 2017-12-23 NOTE — Patient Instructions (Signed)
It was very nice to see you today!  Keep up the good work.  Please let me know when you need refills for your medications.  We will check blood work today.  I will order a cologuard.  We will give you your flu shot.  Please get your mammogram and bone density scan next year.  Come back to see me in 1 year for your next physical, or sooner as needed.   Take care, Dr Jerline Pain   Preventive Care 40-64 Years, Female Preventive care refers to lifestyle choices and visits with your health care provider that can promote health and wellness. What does preventive care include?  A yearly physical exam. This is also called an annual well check.  Dental exams once or twice a year.  Routine eye exams. Ask your health care provider how often you should have your eyes checked.  Personal lifestyle choices, including: ? Daily care of your teeth and gums. ? Regular physical activity. ? Eating a healthy diet. ? Avoiding tobacco and drug use. ? Limiting alcohol use. ? Practicing safe sex. ? Taking low-dose aspirin daily starting at age 64. ? Taking vitamin and mineral supplements as recommended by your health care provider. What happens during an annual well check? The services and screenings done by your health care provider during your annual well check will depend on your age, overall health, lifestyle risk factors, and family history of disease. Counseling Your health care provider may ask you questions about your:  Alcohol use.  Tobacco use.  Drug use.  Emotional well-being.  Home and relationship well-being.  Sexual activity.  Eating habits.  Work and work Statistician.  Method of birth control.  Menstrual cycle.  Pregnancy history.  Screening You may have the following tests or measurements:  Height, weight, and BMI.  Blood pressure.  Lipid and cholesterol levels. These may be checked every 5 years, or more frequently if you are over 64 years old.  Skin  check.  Lung cancer screening. You may have this screening every year starting at age 64 if you have a 30-pack-year history of smoking and currently smoke or have quit within the past 15 years.  Fecal occult blood test (FOBT) of the stool. You may have this test every year starting at age 64.  Flexible sigmoidoscopy or colonoscopy. You may have a sigmoidoscopy every 5 years or a colonoscopy every 10 years starting at age 64.  Hepatitis C blood test.  Hepatitis B blood test.  Sexually transmitted disease (STD) testing.  Diabetes screening. This is done by checking your blood sugar (glucose) after you have not eaten for a while (fasting). You may have this done every 1-3 years.  Mammogram. This may be done every 1-2 years. Talk to your health care provider about when you should start having regular mammograms. This may depend on whether you have a family history of breast cancer.  BRCA-related cancer screening. This may be done if you have a family history of breast, ovarian, tubal, or peritoneal cancers.  Pelvic exam and Pap test. This may be done every 3 years starting at age 64. Starting at age 90, this may be done every 5 years if you have a Pap test in combination with an HPV test.  Bone density scan. This is done to screen for osteoporosis. You may have this scan if you are at high risk for osteoporosis.  Discuss your test results, treatment options, and if necessary, the need for more tests with your health  care provider. Vaccines Your health care provider may recommend certain vaccines, such as:  Influenza vaccine. This is recommended every year.  Tetanus, diphtheria, and acellular pertussis (Tdap, Td) vaccine. You may need a Td booster every 10 years.  Varicella vaccine. You may need this if you have not been vaccinated.  Zoster vaccine. You may need this after age 64.  Measles, mumps, and rubella (MMR) vaccine. You may need at least one dose of MMR if you were born in 1957  or later. You may also need a second dose.  Pneumococcal 13-valent conjugate (PCV13) vaccine. You may need this if you have certain conditions and were not previously vaccinated.  Pneumococcal polysaccharide (PPSV23) vaccine. You may need one or two doses if you smoke cigarettes or if you have certain conditions.  Meningococcal vaccine. You may need this if you have certain conditions.  Hepatitis A vaccine. You may need this if you have certain conditions or if you travel or work in places where you may be exposed to hepatitis A.  Hepatitis B vaccine. You may need this if you have certain conditions or if you travel or work in places where you may be exposed to hepatitis B.  Haemophilus influenzae type b (Hib) vaccine. You may need this if you have certain conditions.  Talk to your health care provider about which screenings and vaccines you need and how often you need them. This information is not intended to replace advice given to you by your health care provider. Make sure you discuss any questions you have with your health care provider. Document Released: 03/14/2015 Document Revised: 11/05/2015 Document Reviewed: 12/17/2014 Elsevier Interactive Patient Education  [employer].

## 2017-12-27 ENCOUNTER — Encounter: Payer: Self-pay | Admitting: Family Medicine

## 2017-12-27 DIAGNOSIS — E785 Hyperlipidemia, unspecified: Secondary | ICD-10-CM | POA: Insufficient documentation

## 2017-12-27 NOTE — Progress Notes (Signed)
Please inform patient of the following:  Her "bad" cholesterol is still a bit high but her good cholesterol is at a good level. Would not recommend starting any medications at this point.  All of her other labs were normal.  She should keep up the good work and we can recheck again in 1 year.

## 2018-03-05 ENCOUNTER — Other Ambulatory Visit: Payer: Self-pay | Admitting: Family Medicine

## 2018-04-13 ENCOUNTER — Encounter: Payer: Self-pay | Admitting: Physician Assistant

## 2018-08-08 ENCOUNTER — Other Ambulatory Visit: Payer: Self-pay | Admitting: Family Medicine

## 2018-08-08 NOTE — Telephone Encounter (Signed)
OV needed?

## 2018-10-31 ENCOUNTER — Other Ambulatory Visit: Payer: Self-pay

## 2018-10-31 ENCOUNTER — Encounter: Payer: Self-pay | Admitting: Family Medicine

## 2018-10-31 MED ORDER — BUPROPION HCL ER (XL) 150 MG PO TB24: 150.0000 mg | ORAL_TABLET | Freq: Every day | ORAL | 1 refills | Status: DC

## 2018-11-21 ENCOUNTER — Encounter: Payer: Self-pay | Admitting: Family Medicine

## 2018-11-22 ENCOUNTER — Other Ambulatory Visit: Payer: Self-pay

## 2018-11-22 MED ORDER — AMLODIPINE BESYLATE 5 MG PO TABS: 5.0000 mg | ORAL_TABLET | Freq: Every day | ORAL | 1 refills | Status: DC

## 2018-12-02 ENCOUNTER — Other Ambulatory Visit: Payer: Self-pay | Admitting: Family Medicine

## 2019-01-02 ENCOUNTER — Encounter: Payer: Self-pay | Admitting: Family Medicine

## 2019-01-02 ENCOUNTER — Other Ambulatory Visit: Payer: Self-pay

## 2019-01-02 ENCOUNTER — Other Ambulatory Visit: Payer: Self-pay | Admitting: Family Medicine

## 2019-01-02 MED ORDER — BUPROPION HCL ER (XL) 150 MG PO TB24: 150.0000 mg | ORAL_TABLET | Freq: Every day | ORAL | 0 refills | Status: DC

## 2019-01-02 NOTE — Telephone Encounter (Signed)
See below

## 2019-01-02 NOTE — Telephone Encounter (Signed)
Copied from [place] [id number]. Topic: Quick Communication - Rx Refill/Question >> Jan 02, 2019  8:42 AM Leward Quan A wrote: Medication: buPROPion (WELLBUTRIN XL) 150 MG 24 hr tablet   Patient ran out need Rx ASAP   Has the patient contacted their pharmacy? Yes.   (Agent: If no, request that the patient contact the pharmacy for the refill.) (Agent: If yes, when and what did the pharmacy advise?)  Preferred Pharmacy (with phone number or street name): [id number] - [place], [place] [phone] (Phone) [phone] (Fax)    Agent: Please be advised that RX refills may take up to 3 business days. We ask that you follow-up with your pharmacy.

## 2019-01-02 NOTE — Telephone Encounter (Signed)
Requested medication (s) are due for refill today: yes  Requested medication (s) are on the active medication list: yes  Last refill:  10/31/2018  Future visit scheduled: yes  Notes to clinic:  Review for refill Patient has appointment on 01/19/2019   Requested Prescriptions  Pending Prescriptions Disp Refills   buPROPion (WELLBUTRIN XL) 150 MG 24 hr tablet 30 tablet 1    Sig: Take 1 tablet (150 mg total) by mouth daily.     Psychiatry: Antidepressants - bupropion Failed - 01/02/2019  8:44 AM      Failed - Valid encounter within last 6 months    Recent Outpatient Visits          1 year ago Encounter for general adult medical examination with abnormal findings   [facility], Algis Greenhouse, MD   1 year ago Chronic pain of left knee   [facility], Algis Greenhouse, MD   1 year ago Essential hypertension   [facility], Algis Greenhouse, MD      Future Appointments            In 2 weeks Vivi Barrack, MD [place], [place] BP in normal range    BP Readings from Last 1 Encounters:  12/23/17 122/78         Passed - Completed PHQ-2 or PHQ-9 in the last 360 days.

## 2019-01-18 ENCOUNTER — Other Ambulatory Visit: Payer: Self-pay

## 2019-01-19 ENCOUNTER — Encounter: Payer: Self-pay | Admitting: Family Medicine

## 2019-01-19 ENCOUNTER — Ambulatory Visit (INDEPENDENT_AMBULATORY_CARE_PROVIDER_SITE_OTHER): Payer: PPO | Admitting: Family Medicine

## 2019-01-19 VITALS — BP 124/78 | HR 90 | Temp 98.3°F | Ht 63.0 in | Wt 133.2 lb

## 2019-01-19 DIAGNOSIS — Z23 Encounter for immunization: Secondary | ICD-10-CM | POA: Diagnosis not present

## 2019-01-19 DIAGNOSIS — E785 Hyperlipidemia, unspecified: Secondary | ICD-10-CM | POA: Diagnosis not present

## 2019-01-19 DIAGNOSIS — I10 Essential (primary) hypertension: Secondary | ICD-10-CM | POA: Diagnosis not present

## 2019-01-19 DIAGNOSIS — Z0001 Encounter for general adult medical examination with abnormal findings: Secondary | ICD-10-CM

## 2019-01-19 DIAGNOSIS — F325 Major depressive disorder, single episode, in full remission: Secondary | ICD-10-CM

## 2019-01-19 LAB — CBC
HCT: 39.3 % (ref 36.0–46.0)
Hemoglobin: 13.3 g/dL (ref 12.0–15.0)
MCHC: 33.7 g/dL (ref 30.0–36.0)
MCV: 97 fl (ref 78.0–100.0)
Platelets: 293 10*3/uL (ref 150.0–400.0)
RBC: 4.06 Mil/uL (ref 3.87–5.11)
RDW: 13.7 % (ref 11.5–15.5)
WBC: 6.9 10*3/uL (ref 4.0–10.5)

## 2019-01-19 LAB — LIPID PANEL
Cholesterol: 319 mg/dL — ABNORMAL HIGH (ref 0–200)
HDL: 114.8 mg/dL (ref >39.00)
LDL Cholesterol: 180 mg/dL — ABNORMAL HIGH (ref 0–99)
NonHDL: 204.63
Total CHOL/HDL Ratio: 3
Triglycerides: 121 mg/dL (ref 0.0–149.0)
VLDL: 24.2 mg/dL (ref 0.0–40.0)

## 2019-01-19 LAB — TSH: TSH: 0.69 u[IU]/mL (ref 0.35–4.50)

## 2019-01-19 LAB — COMPREHENSIVE METABOLIC PANEL
ALT: 23 U/L (ref 0–35)
AST: 24 U/L (ref 0–37)
Albumin: 4.5 g/dL (ref 3.5–5.2)
Alkaline Phosphatase: 59 U/L (ref 39–117)
BUN: 12 mg/dL (ref 6–23)
CO2: 25 mEq/L (ref 19–32)
Calcium: 9.3 mg/dL (ref 8.4–10.5)
Chloride: 100 mEq/L (ref 96–112)
Creatinine, Ser: 0.49 mg/dL (ref 0.40–1.20)
GFR: 126.42 mL/min (ref >60.00)
Glucose, Bld: 90 mg/dL (ref 70–99)
Potassium: 4 mEq/L (ref 3.5–5.1)
Sodium: 138 mEq/L (ref 135–145)
Total Bilirubin: 0.5 mg/dL (ref 0.2–1.2)
Total Protein: 7.2 g/dL (ref 6.0–8.3)

## 2019-01-19 MED ORDER — AMLODIPINE BESYLATE 5 MG PO TABS: 5.0000 mg | ORAL_TABLET | Freq: Every day | ORAL | 3 refills | Status: DC

## 2019-01-19 MED ORDER — BUPROPION HCL ER (XL) 150 MG PO TB24: 150.0000 mg | ORAL_TABLET | Freq: Every day | ORAL | 3 refills | Status: DC

## 2019-01-19 NOTE — Progress Notes (Signed)
Chief Complaint:  Diana Case is a 63 y.o. female who presents today for her annual comprehensive physical exam.    Assessment/Plan:  Dyslipidemia Continue lifestyle modifications.  Check lipid panel.  Depression, major, single episode, complete remission (HCC) Stable.  Continue Wellbutrin 150 mg daily.  Essential hypertension At goal.  Continue Norvasc 5 mg daily.  Check CBC, C met, TSH.  Preventative Healthcare: Pneumonia and flu vaccines given today.  Has bone density scan and mammogram upcoming in the next few weeks.  Has Cologuard kit at home-advised her to send this in soon.  We will check labs today including CBC, C met, TSH.  Advised her to check with insurance about shingles vaccine.  Patient Counseling(The following topics were reviewed and/or handout was given):  -Nutrition: Stressed importance of moderation in sodium/caffeine intake, saturated fat and cholesterol, caloric balance, sufficient intake of fresh fruits, vegetables, and fiber.  -Stressed the importance of regular exercise.   -Substance Abuse: Discussed cessation/primary prevention of tobacco, alcohol, or other drug use; driving or other dangerous activities under the influence; availability of treatment for abuse.   -Injury prevention: Discussed safety belts, safety helmets, smoke detector, smoking near bedding or upholstery.   -Sexuality: Discussed sexually transmitted diseases, partner selection, use of condoms, avoidance of unintended pregnancy and contraceptive alternatives.   -Dental health: Discussed importance of regular tooth brushing, flossing, and dental visits.  -Health maintenance and immunizations reviewed. Please refer to Health maintenance section.  Return to care in 1 year for next preventative visit.     Subjective:  HPI:  She has no acute complaints today.   Her stable, chronic medical conditions are outlined below:   # Essential Hypertension - On norvasc 68m daily and  tolerating well - ROS: No reported chest pain or shortness of breath  # Depression - On Wellbutrin 1551mdaily and tolerating well - ROS: No reported SI or HI  # Dyslipidemia - Working on diet and exercise  Lifestyle Diet: Pescatorian diet. Exercise: Trying to walk more. About a mile every day.   [facility]  Decreased Interest 0  Down, Depressed, Hopeless 0  PHQ - 2 Score 0  Altered sleeping 1  Tired, decreased energy 0  Change in appetite 1  Feeling bad or failure about yourself  0  Trouble concentrating 0  Moving slowly or fidgety/restless 0  Suicidal thoughts 0  PHQ-9 Score 2  Difficult doing work/chores Not difficult at all    Health Maintenance Due  Topic Date Due  . HIV Screening  03/21/1968  . COLONOSCOPY  03/22/2003  . PNA vac Low Risk Adult (1 of 2 - PCV13) 03/21/2018  . INFLUENZA VACCINE  09/30/2018     ROS: Per HPI, otherwise a complete review of systems was negative.   PMH:  The following were reviewed and entered/updated in epic: Past Medical History:  Diagnosis Date  . Dysrhythmia    palpitations  . Hypertension   . MVP (mitral valve prolapse)    Patient Active Problem List   Diagnosis Date Noted  . Dyslipidemia 12/27/2017  . Chronic pain of left knee 05/20/2017  . Essential hypertension 02/17/2017  . [place] major[place]   Past Surgical History:  Procedure Laterality Date  . CESAREAN SECTION    . DIAGNOSTIC LAPAROSCOPY    . LAPAROSCOPIC ASSISTED VAGINAL HYSTERECTOMY N/A 12/26/2013   Procedure: LAPAROSCOPIC ASSISTED VAGINAL HYSTERECTOMY;  Surgeon: JeAnnalee GentaDO;  [facility];  Service: Gynecology;  Laterality: N/A;    History reviewed. No pertinent family history.  Medications- reviewed and updated Current Outpatient Medications  Medication Sig Dispense Refill  . acetaminophen (TYLENOL) 500 MG tablet Take 1,000 mg by mouth every 6 (six) hours as  needed.    Marland Kitchen amLODipine (NORVASC) 5 MG tablet Take 1 tablet (5 mg total) by mouth daily. 90 tablet 3  . buPROPion (WELLBUTRIN XL) 150 MG 24 hr tablet Take 1 tablet (150 mg total) by mouth daily. 90 tablet 3  . ibuprofen (ADVIL,MOTRIN) 200 MG tablet Take 400 mg by mouth as needed.     No current facility-administered medications for this visit.     Allergies-reviewed and updated Allergies  Allergen Reactions  . Adhesive [Tape] Rash    Social History   Socioeconomic History  . Marital status: Married    Spouse name: Not on file  . Number of children: Not on file  . Years of education: Not on file  . Highest education level: Not on file  Occupational History  . Not on file  Social Needs  . Financial resource strain: Not on file  . Food insecurity    Worry: Not on file    Inability: Not on file  . Transportation needs    Medical: Not on file    Non-medical: Not on file  Tobacco Use  . Smoking status: Never Smoker  . Smokeless tobacco: Never Used  Substance and Sexual Activity  . Alcohol use: Yes    Comment: daily  . Drug use: No  . Sexual activity: Not on file  Lifestyle  . Physical activity    Days per week: Not on file    Minutes per session: Not on file  . Stress: Not on file  Relationships  . Social Herbalist on phone: Not on file    Gets together: Not on file    Attends religious service: Not on file    Active member of club or organization: Not on file    Attends meetings of clubs or organizations: Not on file    Relationship status: Not on file  Other Topics Concern  . Not on file  Social History Narrative  . Not on file        Objective:  Physical Exam: BP 124/78   Pulse 90   Temp 98.3 F (36.8 C)   Ht 5' 3"  (1.6 m)   Wt 133 lb 4 oz (60.4 kg)   SpO2 98%   BMI 23.60 kg/m   Body mass index is 23.6 kg/m. Wt Readings from Last 3 Encounters:  01/19/19 133 lb 4 oz (60.4 kg)  12/23/17 132 lb 12.8 oz (60.2 kg)  05/20/17 138 lb 8 oz  (62.8 kg)  Gen: NAD, resting comfortably HEENT: TMs normal bilaterally. OP clear. No thyromegaly noted.  CV: RRR with no murmurs appreciated Pulm: NWOB, CTAB with no crackles, wheezes, or rhonchi GI: Normal bowel sounds present. Soft, Nontender, Nondistended. MSK: no edema, cyanosis, or clubbing noted Skin: warm, dry Neuro: CN2-12 grossly intact. Strength 5/5 in upper and lower extremities. Reflexes symmetric and intact bilaterally.  Psych: Normal affect and thought content     Licet Dunphy M. Jerline Pain, MD 01/19/2019 10:30 AM

## 2019-01-19 NOTE — Patient Instructions (Addendum)
It was very nice to see you today!  We give your flu pneumonia vaccines today.  Please check with your insurance about shingles vaccine.  Please send in your Cologuard when you get a chance.  Please use Debrox for your ears.  I will refill your medications today.  We will check blood work.  Come back in 1 year for your next physical, or sooner if needed.  Take care, Dr Jerline Pain  Please try these tips to maintain a healthy lifestyle:   Eat at least 3 REAL meals and 1-2 snacks per day.  Aim for no more than 5 hours between eating.  If you eat breakfast, please do so within one hour of getting up.    Obtain twice as many fruits/vegetables as protein or carbohydrate foods for both lunch and dinner. (Half of each meal should be fruits/vegetables, one quarter protein, and one quarter starchy carbs)   Cut down on sweet beverages. This includes juice, soda, and sweet tea.    Exercise at least 150 minutes every week.    Preventive Care 65 Years and Older, Female Preventive care refers to lifestyle choices and visits with your health care provider that can promote health and wellness. This includes:  A yearly physical exam. This is also called an annual well check.  Regular dental and eye exams.  Immunizations.  Screening for certain conditions.  Healthy lifestyle choices, such as diet and exercise. What can I expect for my preventive care visit? Physical exam Your health care provider will check:  Height and weight. These may be used to calculate body mass index (BMI), which is a measurement that tells if you are at a healthy weight.  Heart rate and blood pressure.  Your skin for abnormal spots. Counseling Your health care provider may ask you questions about:  Alcohol, tobacco, and drug use.  Emotional well-being.  Home and relationship well-being.  Sexual activity.  Eating habits.  History of falls.  Memory and ability to understand (cognition).  Work  and work Statistician.  Pregnancy and menstrual history. What immunizations do I need?  Influenza (flu) vaccine  This is recommended every year. Tetanus, diphtheria, and pertussis (Tdap) vaccine  You may need a Td booster every 10 years. Varicella (chickenpox) vaccine  You may need this vaccine if you have not already been vaccinated. Zoster (shingles) vaccine  You may need this after age 65. Pneumococcal conjugate (PCV13) vaccine  One dose is recommended after age 3. Pneumococcal polysaccharide (PPSV23) vaccine  One dose is recommended after age 10. Measles, mumps, and rubella (MMR) vaccine  You may need at least one dose of MMR if you were born in 1957 or later. You may also need a second dose. Meningococcal conjugate (MenACWY) vaccine  You may need this if you have certain conditions. Hepatitis A vaccine  You may need this if you have certain conditions or if you travel or work in places where you may be exposed to hepatitis A. Hepatitis B vaccine  You may need this if you have certain conditions or if you travel or work in places where you may be exposed to hepatitis B. Haemophilus influenzae type b (Hib) vaccine  You may need this if you have certain conditions. You may receive vaccines as individual doses or as more than one vaccine together in one shot (combination vaccines). Talk with your health care provider about the risks and benefits of combination vaccines. What tests do I need? Blood tests  Lipid and cholesterol levels. These  may be checked every 5 years, or more frequently depending on your overall health.  Hepatitis C test.  Hepatitis B test. Screening  Lung cancer screening. You may have this screening every year starting at age 65 if you have a 30-pack-year history of smoking and currently smoke or have quit within the past 15 years.  Colorectal cancer screening. All adults should have this screening starting at age 65 and continuing until age 65.  Your health care provider may recommend screening at age 65 if you are at increased risk. You will have tests every 1-10 years, depending on your results and the type of screening test.  Diabetes screening. This is done by checking your blood sugar (glucose) after you have not eaten for a while (fasting). You may have this done every 1-3 years.  Mammogram. This may be done every 1-2 years. Talk with your health care provider about how often you should have regular mammograms.  BRCA-related cancer screening. This may be done if you have a family history of breast, ovarian, tubal, or peritoneal cancers. Other tests  Sexually transmitted disease (STD) testing.  Bone density scan. This is done to screen for osteoporosis. You may have this done starting at age 65. Follow these instructions at home: Eating and drinking  Eat a diet that includes fresh fruits and vegetables, whole grains, lean protein, and low-fat dairy products. Limit your intake of foods with high amounts of sugar, saturated fats, and salt.  Take vitamin and mineral supplements as recommended by your health care provider.  Do not drink alcohol if your health care provider tells you not to drink.  If you drink alcohol: ? Limit how much you have to 0-1 drink a day. ? Be aware of how much alcohol is in your drink. In the [place] one drink equals one 12 oz bottle of beer (355 mL), one 5 oz glass of wine (148 mL), or one 1 oz glass of hard liquor (44 mL). Lifestyle  Take daily care of your teeth and gums.  Stay active. Exercise for at least 30 minutes on 5 or more days each week.  Do not use any products that contain nicotine or tobacco, such as cigarettes, e-cigarettes, and chewing tobacco. If you need help quitting, ask your health care provider.  If you are sexually active, practice safe sex. Use a condom or other form of protection in order to prevent STIs (sexually transmitted infections).  Talk with your health care  provider about taking a low-dose aspirin or statin. What's next?  Go to your health care provider once a year for a well check visit.  Ask your health care provider how often you should have your eyes and teeth checked.  Stay up to date on all vaccines. This information is not intended to replace advice given to you by your health care provider. Make sure you discuss any questions you have with your health care provider. Document Released: 03/14/2015 Document Revised: 02/09/2018 Document Reviewed: 02/09/2018 Elsevier Patient Education  2020 [employer].

## 2019-01-19 NOTE — Assessment & Plan Note (Signed)
Continue lifestyle modifications.  Check lipid panel. 

## 2019-01-19 NOTE — Assessment & Plan Note (Signed)
Stable.  Continue Wellbutrin 150 mg daily. 

## 2019-01-19 NOTE — Assessment & Plan Note (Signed)
At goal.  Continue Norvasc 5 mg daily.  Check CBC, C met, TSH. 

## 2019-01-22 NOTE — Progress Notes (Signed)
Please inform patient of the following:  Cholesterol is up slightly but her good cholesterol is still great - do not need to make any changes at this time. All of her other labs are NORMAL.  She should keep up the good work and we can recheck in a year or so.  Diana Case. Jerline Pain, MD 01/22/2019 9:10 AM

## 2019-01-24 DIAGNOSIS — Z90710 Acquired absence of both cervix and uterus: Secondary | ICD-10-CM | POA: Diagnosis not present

## 2019-01-24 DIAGNOSIS — Z780 Asymptomatic menopausal state: Secondary | ICD-10-CM | POA: Diagnosis not present

## 2019-01-24 DIAGNOSIS — M8589 Other specified disorders of bone density and structure, multiple sites: Secondary | ICD-10-CM | POA: Diagnosis not present

## 2019-01-24 DIAGNOSIS — Z803 Family history of malignant neoplasm of breast: Secondary | ICD-10-CM | POA: Diagnosis not present

## 2019-01-24 DIAGNOSIS — Z1231 Encounter for screening mammogram for malignant neoplasm of breast: Secondary | ICD-10-CM | POA: Diagnosis not present

## 2019-01-24 LAB — HM MAMMOGRAPHY

## 2019-01-24 LAB — HM DEXA SCAN

## 2019-01-29 ENCOUNTER — Other Ambulatory Visit: Payer: Self-pay | Admitting: Family Medicine

## 2019-02-14 ENCOUNTER — Encounter: Payer: Self-pay | Admitting: Family Medicine

## 2019-02-21 ENCOUNTER — Encounter: Payer: Self-pay | Admitting: Family Medicine

## 2019-03-05 DIAGNOSIS — H524 Presbyopia: Secondary | ICD-10-CM | POA: Diagnosis not present

## 2019-03-05 DIAGNOSIS — H52203 Unspecified astigmatism, bilateral: Secondary | ICD-10-CM | POA: Diagnosis not present

## 2019-03-05 DIAGNOSIS — H35372 Puckering of macula, left eye: Secondary | ICD-10-CM | POA: Diagnosis not present

## 2019-03-05 DIAGNOSIS — Z8669 Personal history of other diseases of the nervous system and sense organs: Secondary | ICD-10-CM | POA: Diagnosis not present

## 2019-03-05 DIAGNOSIS — Z9889 Other specified postprocedural states: Secondary | ICD-10-CM | POA: Diagnosis not present

## 2019-03-05 DIAGNOSIS — Z961 Presence of intraocular lens: Secondary | ICD-10-CM | POA: Diagnosis not present

## 2019-04-09 IMAGING — DX DG KNEE AP/LAT W/ SUNRISE*L*
3 series · 3 of 3 positions shown · non-contrast
Comparison: None

CLINICAL DATA: Chronic LEFT knee pain, no known injury

EXAM:
LEFT KNEE 3 VIEWS

[knee standing ap]
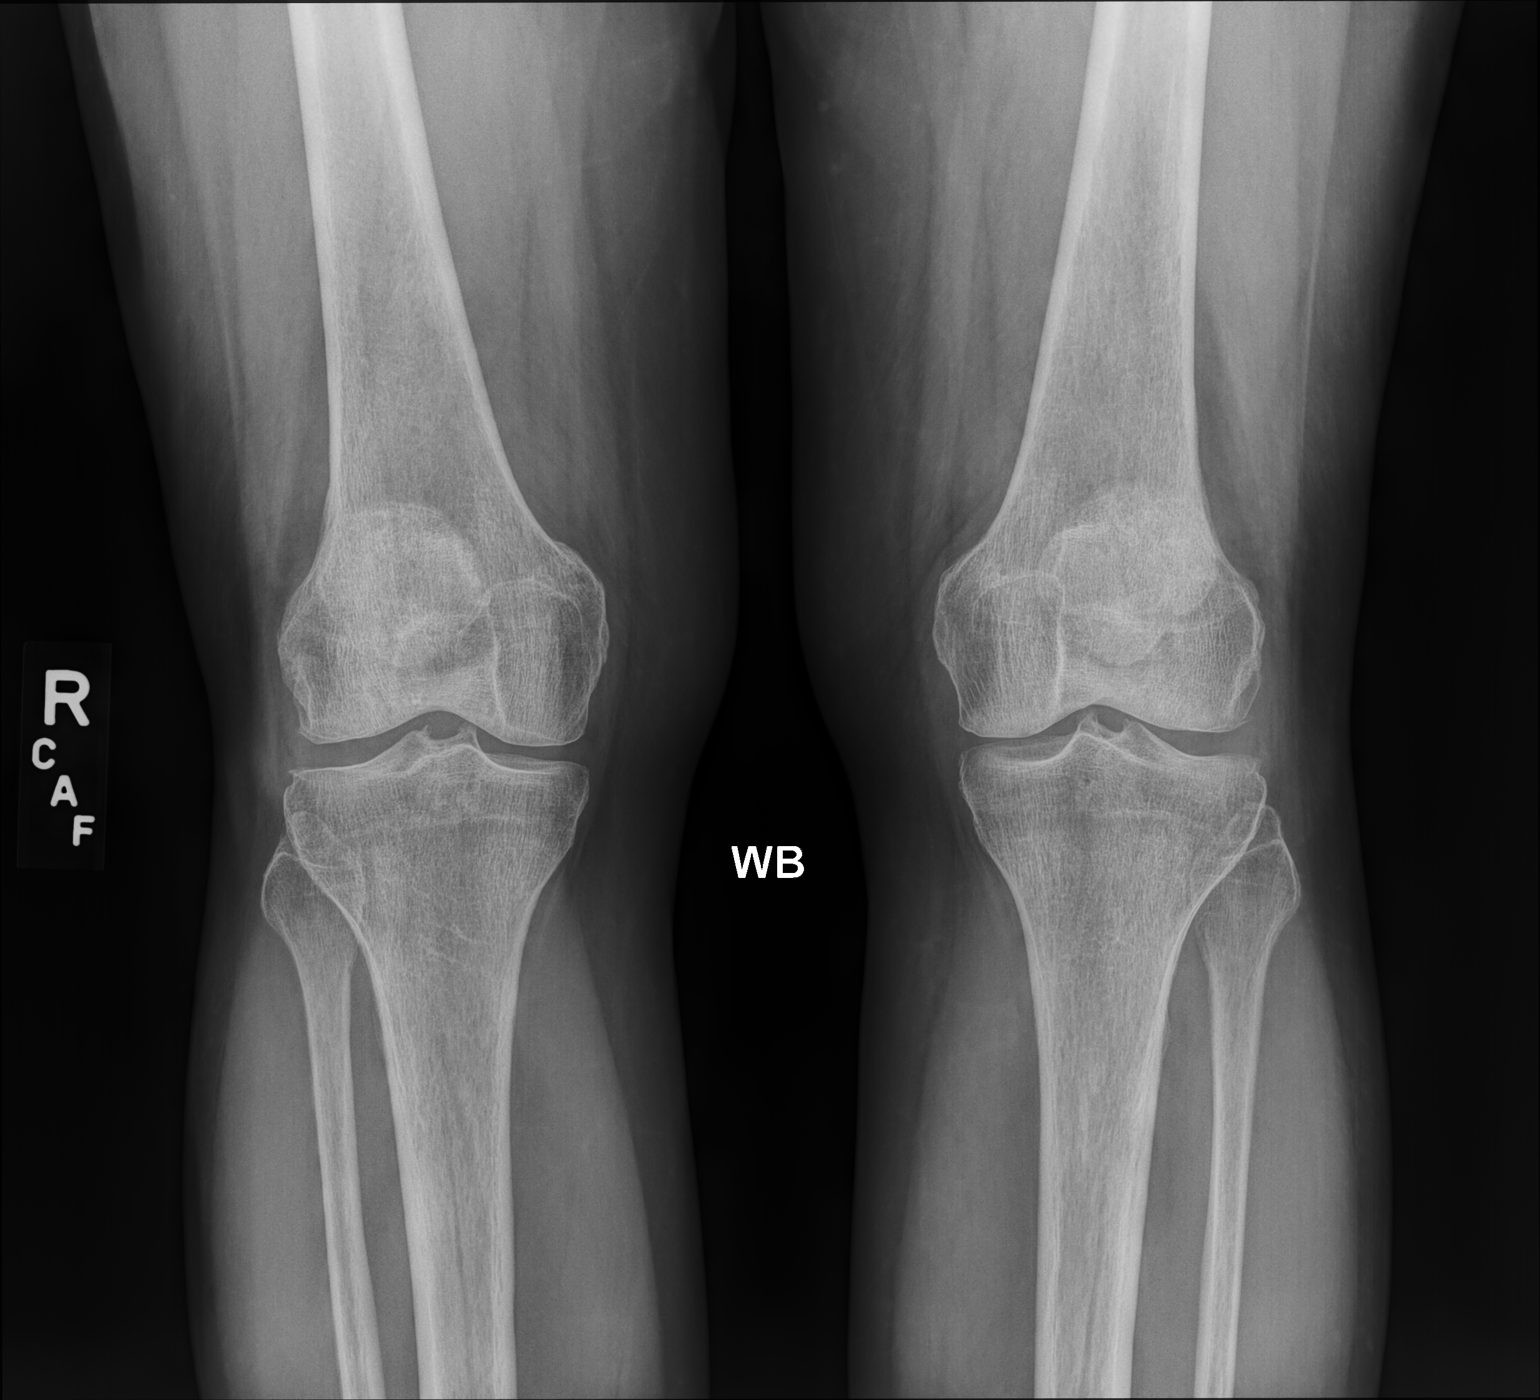

[knee standing lat]
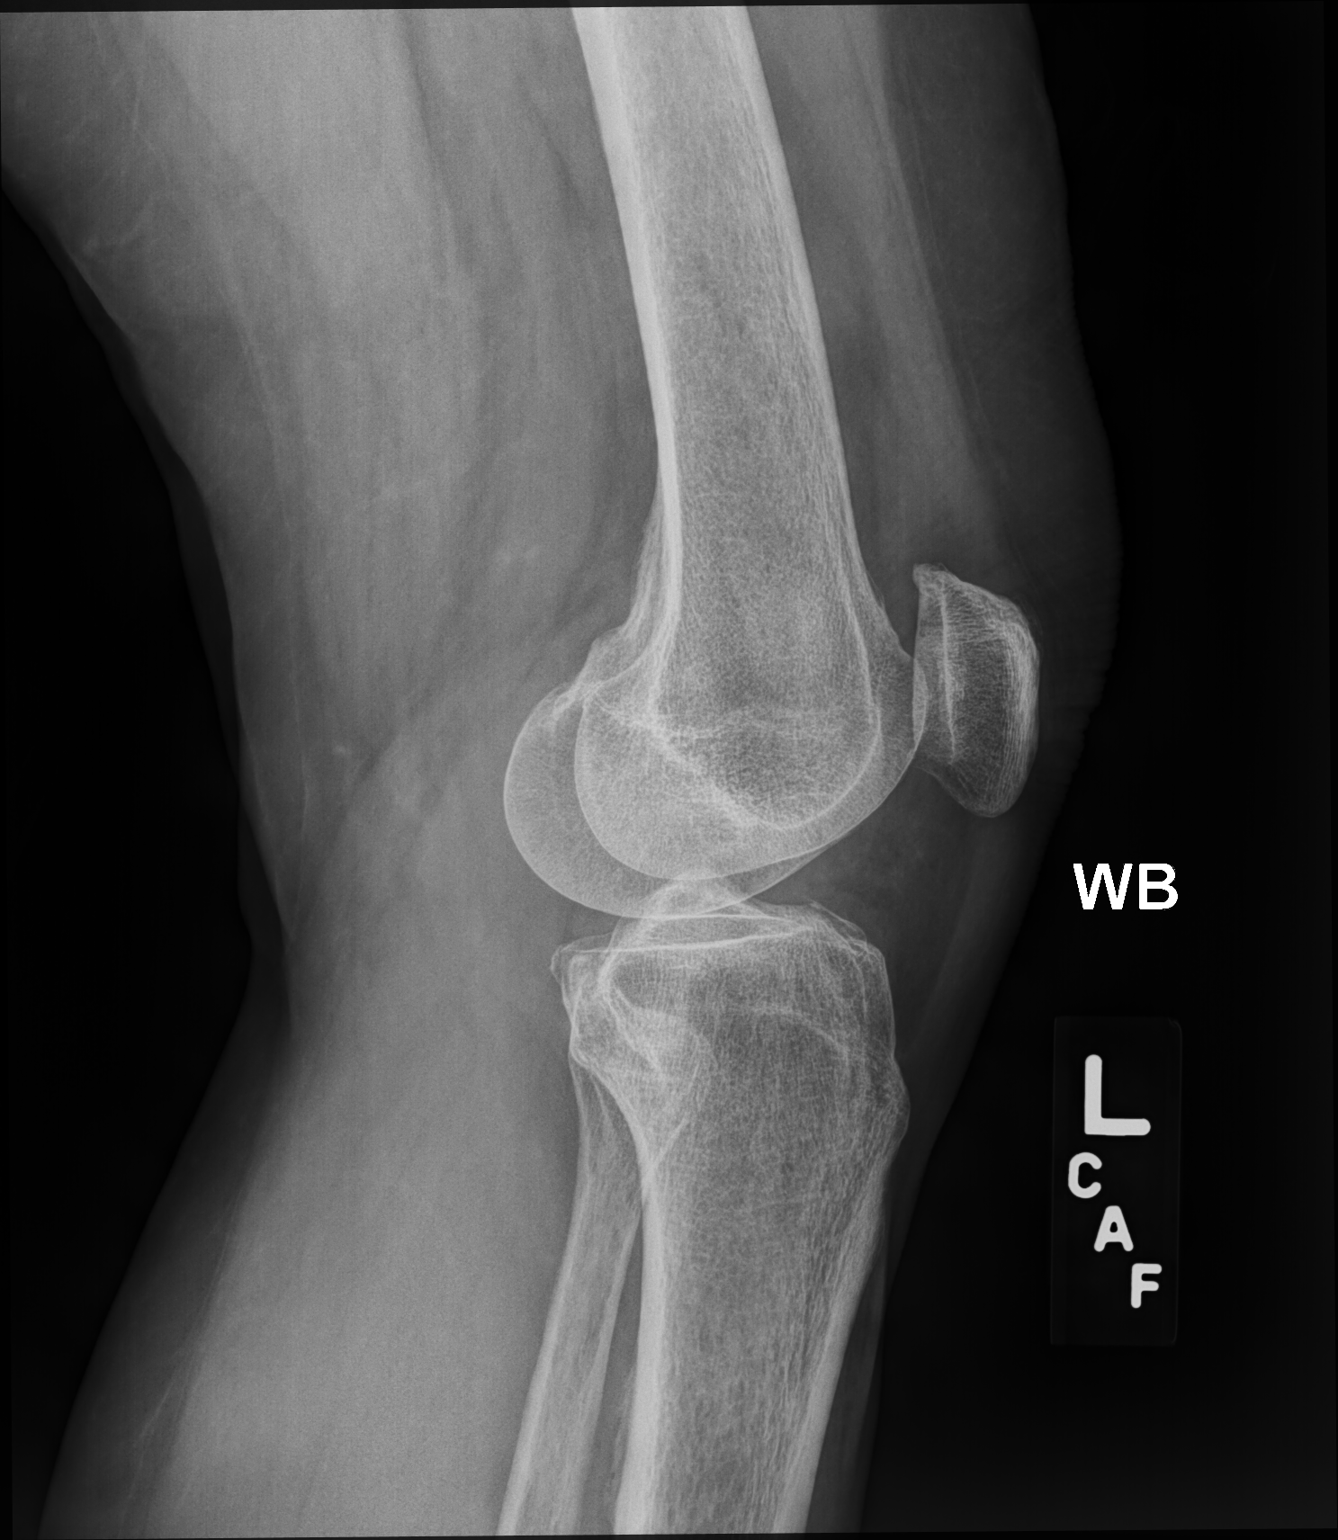

[sunrise]
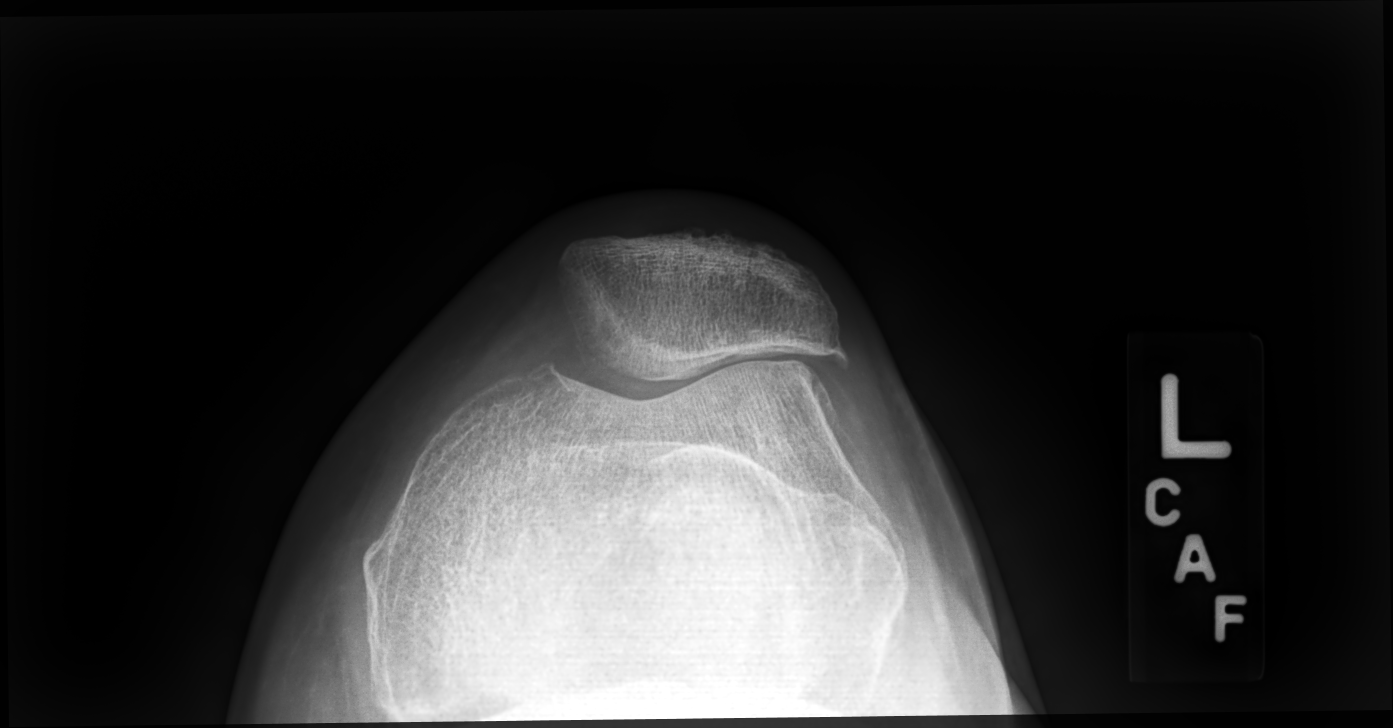

[3 of 3 positions shown; findings below may reference images not displayed]

FINDINGS: Osseous demineralization.

Mild joint space narrowing at medial compartment and at lateral
patellofemoral joint.

Small patellar spur at cranial articular margin.

No acute fracture, dislocation, or bone destruction.

No knee joint effusion.

Comparison AP view of the RIGHT knee shows osseous demineralization.
IMPRESSION: Degenerative changes LEFT knee.

## 2019-08-23 ENCOUNTER — Telehealth: Payer: Self-pay | Admitting: Family Medicine

## 2019-08-23 NOTE — Telephone Encounter (Signed)
Left message for patient to call back and schedule Medicare Annual Wellness Visit (AWV) either virtually/audio only OR in office. Whatever the patients preference is.  No hx; please schedule at anytime with [facility] Advisor at [facility].  This should be a 45 minute visit.  [id number] PER PALMETTO 

## 2020-01-21 ENCOUNTER — Ambulatory Visit (INDEPENDENT_AMBULATORY_CARE_PROVIDER_SITE_OTHER): Payer: PPO | Admitting: Family Medicine

## 2020-01-21 ENCOUNTER — Other Ambulatory Visit: Payer: Self-pay

## 2020-01-21 VITALS — BP 139/80 | HR 105 | Temp 98.6°F | Ht 63.0 in | Wt 130.6 lb

## 2020-01-21 DIAGNOSIS — Z23 Encounter for immunization: Secondary | ICD-10-CM

## 2020-01-21 DIAGNOSIS — I10 Essential (primary) hypertension: Secondary | ICD-10-CM

## 2020-01-21 DIAGNOSIS — E785 Hyperlipidemia, unspecified: Secondary | ICD-10-CM | POA: Diagnosis not present

## 2020-01-21 DIAGNOSIS — F325 Major depressive disorder, single episode, in full remission: Secondary | ICD-10-CM

## 2020-01-21 DIAGNOSIS — Z0001 Encounter for general adult medical examination with abnormal findings: Secondary | ICD-10-CM | POA: Diagnosis not present

## 2020-01-21 DIAGNOSIS — Z1211 Encounter for screening for malignant neoplasm of colon: Secondary | ICD-10-CM

## 2020-01-21 LAB — LIPID PANEL
Cholesterol: 314 mg/dL — ABNORMAL HIGH (ref <200)
HDL: 125 mg/dL (ref >=50)
LDL Cholesterol (Calc): 171 mg/dL (calc) — ABNORMAL HIGH
Non-HDL Cholesterol (Calc): 189 mg/dL (calc) — ABNORMAL HIGH (ref <130)
Total CHOL/HDL Ratio: 2.5 (calc) (ref <5.0)
Triglycerides: 79 mg/dL (ref <150)

## 2020-01-21 LAB — COMPREHENSIVE METABOLIC PANEL
AG Ratio: 1.6 (calc) (ref 1.0–2.5)
ALT: 20 U/L (ref 6–29)
AST: 22 U/L (ref 10–35)
Albumin: 4.7 g/dL (ref 3.6–5.1)
Alkaline phosphatase (APISO): 74 U/L (ref 37–153)
BUN: 12 mg/dL (ref 7–25)
CO2: 27 mmol/L (ref 20–32)
Calcium: 9.7 mg/dL (ref 8.6–10.4)
Chloride: 101 mmol/L (ref 98–110)
Creat: 0.51 mg/dL (ref 0.50–0.99)
Globulin: 2.9 g/dL (calc) (ref 1.9–3.7)
Glucose, Bld: 83 mg/dL (ref 65–99)
Potassium: 4.4 mmol/L (ref 3.5–5.3)
Sodium: 139 mmol/L (ref 135–146)
Total Bilirubin: 0.6 mg/dL (ref 0.2–1.2)
Total Protein: 7.6 g/dL (ref 6.1–8.1)

## 2020-01-21 LAB — CBC
HCT: 40.8 % (ref 35.0–45.0)
Hemoglobin: 13.7 g/dL (ref 11.7–15.5)
MCH: 31 pg (ref 27.0–33.0)
MCHC: 33.6 g/dL (ref 32.0–36.0)
MCV: 92.3 fL (ref 80.0–100.0)
MPV: 9.4 fL (ref 7.5–12.5)
Platelets: 317 10*3/uL (ref 140–400)
RBC: 4.42 10*6/uL (ref 3.80–5.10)
RDW: 12.3 % (ref 11.0–15.0)
WBC: 6.4 10*3/uL (ref 3.8–10.8)

## 2020-01-21 LAB — TSH: TSH: 0.89 mIU/L (ref 0.40–4.50)

## 2020-01-21 MED ORDER — BUPROPION HCL ER (XL) 150 MG PO TB24: 150.0000 mg | ORAL_TABLET | Freq: Every day | ORAL | 3 refills | Status: DC

## 2020-01-21 MED ORDER — AMLODIPINE BESYLATE 5 MG PO TABS: 5.0000 mg | ORAL_TABLET | Freq: Every day | ORAL | 3 refills | Status: DC

## 2020-01-21 NOTE — Patient Instructions (Signed)
It was very nice to see you today!  We will flush your ears today.  We will check blood work today.  We will give you a flu vaccine and your second pneumonia vaccine.  I will place an order for Cologuard.  I will see you back in year. Please come back to see me sooner if needed.  Take care, Dr Jerline Pain  Please try these tips to maintain a healthy lifestyle:   Eat at least 3 REAL meals and 1-2 snacks per day.  Aim for no more than 5 hours between eating.  If you eat breakfast, please do so within one hour of getting up.    Each meal should contain half fruits/vegetables, one quarter protein, and one quarter carbs (no bigger than a computer mouse)   Cut down on sweet beverages. This includes juice, soda, and sweet tea.     Drink at least 1 glass of water with each meal and aim for at least 8 glasses per day   Exercise at least 150 minutes every week.    Preventive Care 15 Years and Older, Female Preventive care refers to lifestyle choices and visits with your health care provider that can promote health and wellness. This includes:  A yearly physical exam. This is also called an annual well check.  Regular dental and eye exams.  Immunizations.  Screening for certain conditions.  Healthy lifestyle choices, such as diet and exercise. What can I expect for my preventive care visit? Physical exam Your health care provider will check:  Height and weight. These may be used to calculate body mass index (BMI), which is a measurement that tells if you are at a healthy weight.  Heart rate and blood pressure.  Your skin for abnormal spots. Counseling Your health care provider may ask you questions about:  Alcohol, tobacco, and drug use.  Emotional well-being.  Home and relationship well-being.  Sexual activity.  Eating habits.  History of falls.  Memory and ability to understand (cognition).  Work and work Statistician.  Pregnancy and menstrual history. What  immunizations do I need?  Influenza (flu) vaccine  This is recommended every year. Tetanus, diphtheria, and pertussis (Tdap) vaccine  You may need a Td booster every 10 years. Varicella (chickenpox) vaccine  You may need this vaccine if you have not already been vaccinated. Zoster (shingles) vaccine  You may need this after age 21. Pneumococcal conjugate (PCV13) vaccine  One dose is recommended after age 31. Pneumococcal polysaccharide (PPSV23) vaccine  One dose is recommended after age 69. Measles, mumps, and rubella (MMR) vaccine  You may need at least one dose of MMR if you were born in 1957 or later. You may also need a second dose. Meningococcal conjugate (MenACWY) vaccine  You may need this if you have certain conditions. Hepatitis A vaccine  You may need this if you have certain conditions or if you travel or work in places where you may be exposed to hepatitis A. Hepatitis B vaccine  You may need this if you have certain conditions or if you travel or work in places where you may be exposed to hepatitis B. Haemophilus influenzae type b (Hib) vaccine  You may need this if you have certain conditions. You may receive vaccines as individual doses or as more than one vaccine together in one shot (combination vaccines). Talk with your health care provider about the risks and benefits of combination vaccines. What tests do I need? Blood tests  Lipid and cholesterol levels.  These may be checked every 5 years, or more frequently depending on your overall health.  Hepatitis C test.  Hepatitis B test. Screening  Lung cancer screening. You may have this screening every year starting at age 80 if you have a 30-pack-year history of smoking and currently smoke or have quit within the past 15 years.  Colorectal cancer screening. All adults should have this screening starting at age 67 and continuing until age 48. Your health care provider may recommend screening at age 75 if  you are at increased risk. You will have tests every 1-10 years, depending on your results and the type of screening test.  Diabetes screening. This is done by checking your blood sugar (glucose) after you have not eaten for a while (fasting). You may have this done every 1-3 years.  Mammogram. This may be done every 1-2 years. Talk with your health care provider about how often you should have regular mammograms.  BRCA-related cancer screening. This may be done if you have a family history of breast, ovarian, tubal, or peritoneal cancers. Other tests  Sexually transmitted disease (STD) testing.  Bone density scan. This is done to screen for osteoporosis. You may have this done starting at age 50. Follow these instructions at home: Eating and drinking  Eat a diet that includes fresh fruits and vegetables, whole grains, lean protein, and low-fat dairy products. Limit your intake of foods with high amounts of sugar, saturated fats, and salt.  Take vitamin and mineral supplements as recommended by your health care provider.  Do not drink alcohol if your health care provider tells you not to drink.  If you drink alcohol: ? Limit how much you have to 0-1 drink a day. ? Be aware of how much alcohol is in your drink. In the [place] one drink equals one 12 oz bottle of beer (355 mL), one 5 oz glass of wine (148 mL), or one 1 oz glass of hard liquor (44 mL). Lifestyle  Take daily care of your teeth and gums.  Stay active. Exercise for at least 30 minutes on 5 or more days each week.  Do not use any products that contain nicotine or tobacco, such as cigarettes, e-cigarettes, and chewing tobacco. If you need help quitting, ask your health care provider.  If you are sexually active, practice safe sex. Use a condom or other form of protection in order to prevent STIs (sexually transmitted infections).  Talk with your health care provider about taking a low-dose aspirin or statin. What's  next?  Go to your health care provider once a year for a well check visit.  Ask your health care provider how often you should have your eyes and teeth checked.  Stay up to date on all vaccines. This information is not intended to replace advice given to you by your health care provider. Make sure you discuss any questions you have with your health care provider. Document Revised: 02/09/2018 Document Reviewed: 02/09/2018 Elsevier Patient Education  2020 [employer].

## 2020-01-21 NOTE — Addendum Note (Signed)
Addended by: Ardith Dark on: 01/21/2020 10:09 AM   Modules accepted: Level of Service

## 2020-01-21 NOTE — Assessment & Plan Note (Addendum)
Check lipids.  Continue lifestyle modifications. 

## 2020-01-21 NOTE — Assessment & Plan Note (Signed)
Doing well.  Continue Wellbutrin 150 mg daily.

## 2020-01-21 NOTE — Progress Notes (Signed)
Chief Complaint:  Diana Case is a 64 y.o. female who presents today for her annual comprehensive physical exam.    Assessment/Plan:  New/Acute Problems: Cerumen Impaction Successfully irrigated by RMA today.  Chronic Problems Addressed Today: Dyslipidemia Check lipids.  Continue lifestyle modifications.  Depression, major, single episode, complete remission (HCC) Doing well.  Continue Wellbutrin 150 mg daily.  Essential hypertension At goal.  Continue Norvasc 5 mg daily.  Check CBC, CMET, TSH.  Preventative Healthcare: Pneumovax and flu vaccine given today.  Will place order for Cologuard.  Will be getting mammogram next week.  Patient Counseling(The following topics were reviewed and/or handout was given):  -Nutrition: Stressed importance of moderation in sodium/caffeine intake, saturated fat and cholesterol, caloric balance, sufficient intake of fresh fruits, vegetables, and fiber.  -Stressed the importance of regular exercise.   -Substance Abuse: Discussed cessation/primary prevention of tobacco, alcohol, or other drug use; driving or other dangerous activities under the influence; availability of treatment for abuse.   -Injury prevention: Discussed safety belts, safety helmets, smoke detector, smoking near bedding or upholstery.   -Sexuality: Discussed sexually transmitted diseases, partner selection, use of condoms, avoidance of unintended pregnancy and contraceptive alternatives.   -Dental health: Discussed importance of regular tooth brushing, flossing, and dental visits.  -Health maintenance and immunizations reviewed. Please refer to Health maintenance section.  Return to care in 1 year for next preventative visit.     Subjective:  HPI:  She has no acute complaints today. See A/p for status of chronic conditions.   Lifestyle Diet: Pescatorian diet.  Exercise: Likes walking.   Depression screen [facility] 2/9 01/19/2019  Decreased Interest 0  Down,  Depressed, Hopeless 0  PHQ - 2 Score 0  Altered sleeping 1  Tired, decreased energy 0  Change in appetite 1  Feeling bad or failure about yourself  0  Trouble concentrating 0  Moving slowly or fidgety/restless 0  Suicidal thoughts 0  PHQ-9 Score 2  Difficult doing work/chores Not difficult at all   Health Maintenance Due  Topic Date Due  . TETANUS/TDAP  Never done  . Fecal DNA (Cologuard)  Never done  . INFLUENZA VACCINE  09/30/2019  . PNA vac Low Risk Adult (2 of 2 - PPSV23) 01/19/2020    ROS: Per HPI, otherwise a complete review of systems was negative.   PMH:  The following were reviewed and entered/updated in epic: Past Medical History:  Diagnosis Date  . Dysrhythmia    palpitations  . Hypertension   . MVP (mitral valve prolapse)    Patient Active Problem List   Diagnosis Date Noted  . Dyslipidemia 12/27/2017  . Chronic pain of left knee 05/20/2017  . Essential hypertension 02/17/2017  . Depression, major, single episode, complete remission (HCC) 02/17/2017  . Retinal detachment, left 01/06/2012  . Retinal tear 01/06/2012  . Status post intraocular lens implant 01/06/2012  . Recurrent HSV (herpes simplex virus) 08/27/2011   Past Surgical History:  Procedure Laterality Date  . CESAREAN SECTION    . DIAGNOSTIC LAPAROSCOPY    . LAPAROSCOPIC ASSISTED VAGINAL HYSTERECTOMY N/A 12/26/2013   Procedure: LAPAROSCOPIC ASSISTED VAGINAL HYSTERECTOMY;  Surgeon: Sharon Seller, DO;  Location: [facility];  Service: Gynecology;  Laterality: N/A;    No family history on file.  Medications- reviewed and updated Current Outpatient Medications  Medication Sig Dispense Refill  . acetaminophen (TYLENOL) 500 MG tablet Take 1,000 mg by mouth every 6 (six) hours as needed.    Marland Kitchen amLODipine (NORVASC) 5 MG  tablet Take 1 tablet (5 mg total) by mouth daily. 90 tablet 3  . buPROPion (WELLBUTRIN XL) 150 MG 24 hr tablet Take 1 tablet (150 mg total) by mouth daily. 90 tablet 3  . ibuprofen  (ADVIL,MOTRIN) 200 MG tablet Take 400 mg by mouth as needed.     No current facility-administered medications for this visit.    Allergies-reviewed and updated Allergies  Allergen Reactions  . Adhesive [Tape] Rash    Social History   Socioeconomic History  . Marital status: Married    Spouse name: Not on file  . Number of children: Not on file  . Years of education: Not on file  . Highest education level: Not on file  Occupational History  . Not on file  Tobacco Use  . Smoking status: Never Smoker  . Smokeless tobacco: Never Used  Substance and Sexual Activity  . Alcohol use: Yes    Comment: daily  . Drug use: No  . Sexual activity: Not on file  Other Topics Concern  . Not on file  Social History Narrative  . Not on file   Social Determinants of Health   Financial Resource Strain:   . Difficulty of Paying Living Expenses: Not on file  Food Insecurity:   . Worried About Programme researcher, broadcasting/film/video in the Last Year: Not on file  . Ran Out of Food in the Last Year: Not on file  Transportation Needs:   . Lack of Transportation (Medical): Not on file  . Lack of Transportation (Non-Medical): Not on file  Physical Activity:   . Days of Exercise per Week: Not on file  . Minutes of Exercise per Session: Not on file  Stress:   . Feeling of Stress : Not on file  Social Connections:   . Frequency of Communication with Friends and Family: Not on file  . Frequency of Social Gatherings with Friends and Family: Not on file  . Attends Religious Services: Not on file  . Active Member of Clubs or Organizations: Not on file  . Attends Banker Meetings: Not on file  . Marital Status: Not on file        Objective:  Physical Exam: BP 139/80   Pulse (!) 105   Temp 98.6 F (37 C) (Temporal)   Ht 5\' 3"  (1.6 m)   Wt 130 lb 9.6 oz (59.2 kg)   SpO2 98%   BMI 23.13 kg/m   Body mass index is 23.13 kg/m. Wt Readings from Last 3 Encounters:  01/21/20 130 lb 9.6 oz (59.2  kg)  01/19/19 133 lb 4 oz (60.4 kg)  12/23/17 132 lb 12.8 oz (60.2 kg)   Gen: NAD, resting comfortably HEENT: TMs normal bilaterally. OP clear. No thyromegaly noted.  CV: RRR with no murmurs appreciated Pulm: NWOB, CTAB with no crackles, wheezes, or rhonchi GI: Normal bowel sounds present. Soft, Nontender, Nondistended. MSK: no edema, cyanosis, or clubbing noted Skin: warm, dry Neuro: CN2-12 grossly intact. Strength 5/5 in upper and lower extremities. Reflexes symmetric and intact bilaterally.  Psych: Normal affect and thought content     Shigeru Lampert M. 12/25/17, MD 01/21/2020 9:24 AM

## 2020-01-21 NOTE — Assessment & Plan Note (Signed)
At goal.  Continue Norvasc 5 mg daily.  Check CBC, CMET, TSH.

## 2020-01-21 NOTE — Addendum Note (Signed)
Addended by: Dyann Kief on: 01/21/2020 10:00 AM   Modules accepted: Orders

## 2020-01-22 NOTE — Progress Notes (Signed)
Please inform patient of the following:  Lab work is all stable. Would like for her to keep up the good work and we can recheck in a year.  Katina Degree. [name], MD 01/22/2020 2:28 PM

## 2020-01-30 DIAGNOSIS — Z1231 Encounter for screening mammogram for malignant neoplasm of breast: Secondary | ICD-10-CM | POA: Diagnosis not present

## 2020-01-30 LAB — HM MAMMOGRAPHY

## 2020-02-11 ENCOUNTER — Encounter: Payer: Self-pay | Admitting: Family Medicine

## 2021-01-17 ENCOUNTER — Other Ambulatory Visit: Payer: Self-pay | Admitting: Family Medicine

## 2021-01-27 ENCOUNTER — Other Ambulatory Visit: Payer: Self-pay

## 2021-01-27 ENCOUNTER — Ambulatory Visit (INDEPENDENT_AMBULATORY_CARE_PROVIDER_SITE_OTHER): Payer: PPO | Admitting: Family Medicine

## 2021-01-27 ENCOUNTER — Encounter: Payer: Self-pay | Admitting: Family Medicine

## 2021-01-27 VITALS — BP 115/73 | HR 82 | Temp 98.2°F | Ht 63.0 in | Wt 133.8 lb

## 2021-01-27 DIAGNOSIS — I10 Essential (primary) hypertension: Secondary | ICD-10-CM | POA: Diagnosis not present

## 2021-01-27 DIAGNOSIS — F325 Major depressive disorder, single episode, in full remission: Secondary | ICD-10-CM

## 2021-01-27 DIAGNOSIS — Z0001 Encounter for general adult medical examination with abnormal findings: Secondary | ICD-10-CM | POA: Diagnosis not present

## 2021-01-27 DIAGNOSIS — Z23 Encounter for immunization: Secondary | ICD-10-CM

## 2021-01-27 DIAGNOSIS — E785 Hyperlipidemia, unspecified: Secondary | ICD-10-CM

## 2021-01-27 DIAGNOSIS — R739 Hyperglycemia, unspecified: Secondary | ICD-10-CM

## 2021-01-27 LAB — CBC
HCT: 40.2 % (ref 36.0–46.0)
Hemoglobin: 13.6 g/dL (ref 12.0–15.0)
MCHC: 33.8 g/dL (ref 30.0–36.0)
MCV: 92.5 fl (ref 78.0–100.0)
Platelets: 326 10*3/uL (ref 150.0–400.0)
RBC: 4.35 Mil/uL (ref 3.87–5.11)
RDW: 12.7 % (ref 11.5–15.5)
WBC: 5.8 10*3/uL (ref 4.0–10.5)

## 2021-01-27 LAB — COMPREHENSIVE METABOLIC PANEL
ALT: 26 U/L (ref 0–35)
AST: 18 U/L (ref 0–37)
Albumin: 4.4 g/dL (ref 3.5–5.2)
Alkaline Phosphatase: 56 U/L (ref 39–117)
BUN: 9 mg/dL (ref 6–23)
CO2: 28 mEq/L (ref 19–32)
Calcium: 9.8 mg/dL (ref 8.4–10.5)
Chloride: 102 mEq/L (ref 96–112)
Creatinine, Ser: 0.51 mg/dL (ref 0.40–1.20)
GFR: 96.3 mL/min (ref >60.00)
Glucose, Bld: 102 mg/dL — ABNORMAL HIGH (ref 70–99)
Potassium: 4.4 mEq/L (ref 3.5–5.1)
Sodium: 139 mEq/L (ref 135–145)
Total Bilirubin: 0.4 mg/dL (ref 0.2–1.2)
Total Protein: 7.6 g/dL (ref 6.0–8.3)

## 2021-01-27 LAB — LIPID PANEL
Cholesterol: 295 mg/dL — ABNORMAL HIGH (ref 0–200)
HDL: 71.9 mg/dL (ref >39.00)
LDL Cholesterol: 196 mg/dL — ABNORMAL HIGH (ref 0–99)
NonHDL: 223.16
Total CHOL/HDL Ratio: 4
Triglycerides: 134 mg/dL (ref 0.0–149.0)
VLDL: 26.8 mg/dL (ref 0.0–40.0)

## 2021-01-27 LAB — TSH: TSH: 1.2 u[IU]/mL (ref 0.35–5.50)

## 2021-01-27 LAB — HEMOGLOBIN A1C: Hgb A1c MFr Bld: 6 % (ref 4.6–6.5)

## 2021-01-27 NOTE — Assessment & Plan Note (Signed)
At goal on amlodipine 5 mg daily.  Refill today.  Check labs.

## 2021-01-27 NOTE — Assessment & Plan Note (Signed)
Check labs.  Discussed lifestyle modifications. 

## 2021-01-27 NOTE — Patient Instructions (Signed)
It was very nice to see you today!  I am glad that you are doing well!  We gave you a flu shot today.  We will check blood work today.  Please continue working on diet and exercise.  I will see you back in 1 year for your next physical.  Please come back to see me sooner if needed.  Take care, Dr Jerline Pain  PLEASE NOTE:  If you had any lab tests please let us know if you have not heard back within a few days. You may see your results on mychart before we have a chance to review them but we will give you a call once they are reviewed by [place]. If we ordered any referrals today, please let us know if you have not heard from their office within the next week.   Please try these tips to maintain a healthy lifestyle:  Eat at least 3 REAL meals and 1-2 snacks per day.  Aim for no more than 5 hours between eating.  If you eat breakfast, please do so within one hour of getting up.   Each meal should contain half fruits/vegetables, one quarter protein, and one quarter carbs (no bigger than a computer mouse)  Cut down on sweet beverages. This includes juice, soda, and sweet tea.   Drink at least 1 glass of water with each meal and aim for at least 8 glasses per day  Exercise at least 150 minutes every week.    Preventive Care 78 Years and Older, Female Preventive care refers to lifestyle choices and visits with your health care provider that can promote health and wellness. Preventive care visits are also called wellness exams. What can I expect for my preventive care visit? Counseling Your health care provider may ask you questions about your: Medical history, including: Past medical problems. Family medical history. Pregnancy and menstrual history. History of falls. Current health, including: Memory and ability to understand (cognition). Emotional well-being. Home life and relationship well-being. Sexual activity and sexual health. Lifestyle, including: Alcohol, nicotine or tobacco,  and drug use. Access to firearms. Diet, exercise, and sleep habits. Work and work Statistician. Sunscreen use. Safety issues such as seatbelt and bike helmet use. Physical exam Your health care provider will check your: Height and weight. These may be used to calculate your BMI (body mass index). BMI is a measurement that tells if you are at a healthy weight. Waist circumference. This measures the distance around your waistline. This measurement also tells if you are at a healthy weight and may help predict your risk of certain diseases, such as type 2 diabetes and high blood pressure. Heart rate and blood pressure. Body temperature. Skin for abnormal spots. What immunizations do I need? Vaccines are usually given at various ages, according to a schedule. Your health care provider will recommend vaccines for you based on your age, medical history, and lifestyle or other factors, such as travel or where you work. What tests do I need? Screening Your health care provider may recommend screening tests for certain conditions. This may include: Lipid and cholesterol levels. Hepatitis C test. Hepatitis B test. HIV (human immunodeficiency virus) test. STI (sexually transmitted infection) testing, if you are at risk. Lung cancer screening. Colorectal cancer screening. Diabetes screening. This is done by checking your blood sugar (glucose) after you have not eaten for a while (fasting). Mammogram. Talk with your health care provider about how often you should have regular mammograms. BRCA-related cancer screening. This may be done  if you have a family history of breast, ovarian, tubal, or peritoneal cancers. Bone density scan. This is done to screen for osteoporosis. Talk with your health care provider about your test results, treatment options, and if necessary, the need for more tests. Follow these instructions at home: Eating and drinking  Eat a diet that includes fresh fruits and  vegetables, whole grains, lean protein, and low-fat dairy products. Limit your intake of foods with high amounts of sugar, saturated fats, and salt. Take vitamin and mineral supplements as recommended by your health care provider. Do not drink alcohol if your health care provider tells you not to drink. If you drink alcohol: Limit how much you have to 0-1 drink a day. Know how much alcohol is in your drink. In the [place] one drink equals one 12 oz bottle of beer (355 mL), one 5 oz glass of wine (148 mL), or one 1 oz glass of hard liquor (44 mL). Lifestyle Brush your teeth every morning and night with fluoride toothpaste. Floss one time each day. Exercise for at least 30 minutes 5 or more days each week. Do not use any products that contain nicotine or tobacco. These products include cigarettes, chewing tobacco, and vaping devices, such as e-cigarettes. If you need help quitting, ask your health care provider. Do not use drugs. If you are sexually active, practice safe sex. Use a condom or other form of protection in order to prevent STIs. Take aspirin only as told by your health care provider. Make sure that you understand how much to take and what form to take. Work with your health care provider to find out whether it is safe and beneficial for you to take aspirin daily. Ask your health care provider if you need to take a cholesterol-lowering medicine (statin). Find healthy ways to manage stress, such as: Meditation, yoga, or listening to music. Journaling. Talking to a trusted person. Spending time with friends and family. Minimize exposure to UV radiation to reduce your risk of skin cancer. Safety Always wear your seat belt while driving or riding in a vehicle. Do not drive: If you have been drinking alcohol. Do not ride with someone who has been drinking. When you are tired or distracted. While texting. If you have been using any mind-altering substances or drugs. Wear a helmet and  other protective equipment during sports activities. If you have firearms in your house, make sure you follow all gun safety procedures. What's next? Visit your health care provider once a year for an annual wellness visit. Ask your health care provider how often you should have your eyes and teeth checked. Stay up to date on all vaccines. This information is not intended to replace advice given to you by your health care provider. Make sure you discuss any questions you have with your health care provider. Document Revised: 08/13/2020 Document Reviewed: 08/13/2020 Elsevier Patient Education  [place].

## 2021-01-27 NOTE — Progress Notes (Signed)
Chief Complaint:  Diana Case is a 65 y.o. female who presents today for her annual comprehensive physical exam.    Assessment/Plan:  Chronic Problems Addressed Today: Dyslipidemia Check labs.  Discussed lifestyle modifications.  Depression, major, single episode, complete remission (HCC) Doing very well on Wellbutrin 150 mg daily.  Will refill today.  Essential hypertension At goal on amlodipine 5 mg daily.  Refill today.  Check labs.  Preventative Healthcare: Flu shot given today.  Cologuard is pending -she completed this a week ago and is awaiting results.  Mammogram will be next week.  She will be checking with new insurance about shingles vaccine.  We will check labs today.  Patient Counseling(The following topics were reviewed and/or handout was given):  -Nutrition: Stressed importance of moderation in sodium/caffeine intake, saturated fat and cholesterol, caloric balance, sufficient intake of fresh fruits, vegetables, and fiber.  -Stressed the importance of regular exercise.   -Substance Abuse: Discussed cessation/primary prevention of tobacco, alcohol, or other drug use; driving or other dangerous activities under the influence; availability of treatment for abuse.   -Injury prevention: Discussed safety belts, safety helmets, smoke detector, smoking near bedding or upholstery.   -Sexuality: Discussed sexually transmitted diseases, partner selection, use of condoms, avoidance of unintended pregnancy and contraceptive alternatives.   -Dental health: Discussed importance of regular tooth brushing, flossing, and dental visits.  -Health maintenance and immunizations reviewed. Please refer to Health maintenance section.  Return to care in 1 year for next preventative visit.     Subjective:  HPI:  She has no acute complaints today.   Lifestyle Diet: Balanced.  Exercise: Calesthenics. Likes walking.   Depression screen [facility] 2/9 01/27/2021  Decreased Interest 0   Down, Depressed, Hopeless 0  PHQ - 2 Score 0  Altered sleeping 0  Tired, decreased energy 0  Change in appetite 0  Feeling bad or failure about yourself  0  Trouble concentrating 0  Moving slowly or fidgety/restless 0  Suicidal thoughts 0  PHQ-9 Score 0  Difficult doing work/chores -    Health Maintenance Due  Topic Date Due   Fecal DNA (Cologuard)  Never done     ROS: Per HPI, otherwise a complete review of systems was negative.   PMH:  The following were reviewed and entered/updated in epic: Past Medical History:  Diagnosis Date   Dysrhythmia    palpitations   Hypertension    MVP (mitral valve prolapse)    Patient Active Problem List   Diagnosis Date Noted   Dyslipidemia 12/27/2017   Chronic pain of left knee 05/20/2017   Essential hypertension 02/17/2017   Depression, major, single episode, complete remission (HCC) 02/17/2017   Retinal detachment, left 01/06/2012   Retinal tear 01/06/2012   Status post intraocular lens implant 01/06/2012   Recurrent HSV (herpes simplex virus) 08/27/2011   Past Surgical History:  Procedure Laterality Date   CESAREAN SECTION     DIAGNOSTIC LAPAROSCOPY     LAPAROSCOPIC ASSISTED VAGINAL HYSTERECTOMY N/A 12/26/2013   Procedure: LAPAROSCOPIC ASSISTED VAGINAL HYSTERECTOMY;  Surgeon: Sharon Seller, DO;  Location: [facility];  Service: Gynecology;  Laterality: N/A;    History reviewed. No pertinent family history.  Medications- reviewed and updated Current Outpatient Medications  Medication Sig Dispense Refill   acetaminophen (TYLENOL) 500 MG tablet Take 1,000 mg by mouth every 6 (six) hours as needed.     amLODipine (NORVASC) 5 MG tablet TAKE 1 TABLET(5 MG) BY MOUTH DAILY 90 tablet 1   buPROPion (WELLBUTRIN XL)  150 MG 24 hr tablet TAKE 1 TABLET(150 MG) BY MOUTH DAILY 90 tablet 1   ibuprofen (ADVIL,MOTRIN) 200 MG tablet Take 400 mg by mouth as needed.     Melatonin 10 MG TABS Take by mouth.     No current facility-administered  medications for this visit.    Allergies-reviewed and updated Allergies  Allergen Reactions   Adhesive [Tape] Rash    Social History   Socioeconomic History   Marital status: Married    Spouse name: Not on file   Number of children: Not on file   Years of education: Not on file   Highest education level: Not on file  Occupational History   Not on file  Tobacco Use   Smoking status: Never   Smokeless tobacco: Never  Substance and Sexual Activity   Alcohol use: Yes    Comment: daily   Drug use: No   Sexual activity: Not on file  Other Topics Concern   Not on file  Social History Narrative   Not on file   Social Determinants of Health   Financial Resource Strain: Not on file  Food Insecurity: Not on file  Transportation Needs: Not on file  Physical Activity: Not on file  Stress: Not on file  Social Connections: Not on file        Objective:  Physical Exam: BP 115/73   Pulse 82   Temp 98.2 F (36.8 C) (Temporal)   Ht 5\' 3"  (1.6 m)   Wt 133 lb 12.8 oz (60.7 kg)   SpO2 98%   BMI 23.70 kg/m   Body mass index is 23.7 kg/m. Wt Readings from Last 3 Encounters:  01/27/21 133 lb 12.8 oz (60.7 kg)  01/21/20 130 lb 9.6 oz (59.2 kg)  01/19/19 133 lb 4 oz (60.4 kg)   Gen: NAD, resting comfortably HEENT: TMs normal bilaterally. OP clear. No thyromegaly noted.  CV: RRR with no murmurs appreciated Pulm: NWOB, CTAB with no crackles, wheezes, or rhonchi GI: Normal bowel sounds present. Soft, Nontender, Nondistended. MSK: no edema, cyanosis, or clubbing noted Skin: warm, dry Neuro: CN2-12 grossly intact. Strength 5/5 in upper and lower extremities. Reflexes symmetric and intact bilaterally.  Psych: Normal affect and thought content     [name] M. 01/21/19, MD 01/27/2021 8:48 AM

## 2021-01-27 NOTE — Progress Notes (Signed)
Please inform patient of the following:  Her cholesterol and blood sugar levels are borderline but stable. Everything else is NORMAL. Do not need to make any changes to her treatment plan at this time. She should continue working on diet and exercise and we can recheck in a year or so.  Diana Case. [name], MD 01/27/2021 12:46 PM

## 2021-01-27 NOTE — Assessment & Plan Note (Signed)
Doing very well on Wellbutrin 150 mg daily.  Will refill today.

## 2021-02-03 ENCOUNTER — Telehealth: Payer: Self-pay

## 2021-02-03 NOTE — Telephone Encounter (Signed)
Received a call from [facility], they need a referral to a bone density today. Patient is coming in to their office at 8 am. Fax is [phone]

## 2021-02-04 DIAGNOSIS — Z1231 Encounter for screening mammogram for malignant neoplasm of breast: Secondary | ICD-10-CM | POA: Diagnosis not present

## 2021-02-04 LAB — HM MAMMOGRAPHY

## 2021-02-04 NOTE — Telephone Encounter (Signed)
Referral was faxed on 02/03/2021

## 2021-02-06 ENCOUNTER — Encounter: Payer: Self-pay | Admitting: Family Medicine

## 2021-04-22 ENCOUNTER — Other Ambulatory Visit: Payer: Self-pay | Admitting: Family Medicine

## 2021-08-05 ENCOUNTER — Encounter: Payer: Self-pay | Admitting: Family Medicine

## 2021-08-06 ENCOUNTER — Telehealth (INDEPENDENT_AMBULATORY_CARE_PROVIDER_SITE_OTHER): Payer: PPO | Admitting: Family

## 2021-08-06 ENCOUNTER — Encounter: Payer: Self-pay | Admitting: Family

## 2021-08-06 VITALS — Ht 63.0 in | Wt 133.5 lb

## 2021-08-06 DIAGNOSIS — U071 COVID-19: Secondary | ICD-10-CM

## 2021-08-06 MED ORDER — NIRMATRELVIR/RITONAVIR (PAXLOVID)TABLET: 3.0000 | ORAL_TABLET | Freq: Two times a day (BID) | ORAL | 0 refills | Status: AC

## 2021-08-06 NOTE — Telephone Encounter (Signed)
Patient needs virtual visit with any open provider  Patient notified. Aware will call with appointment information

## 2021-08-06 NOTE — Progress Notes (Signed)
MyChart Video Visit    Virtual Visit via Video Note   This visit type was conducted due to national recommendations for restrictions regarding the COVID-19 Pandemic (e.g. social distancing) in an effort to limit this patient's exposure and mitigate transmission in our community. This patient is at least at moderate risk for complications without adequate follow up. This format is felt to be most appropriate for this patient at this time. Physical exam was limited by quality of the video and audio technology used for the visit. CMA was able to get the patient set up on a video visit.  Patient location: Home. Patient and provider in visit Provider location: Office  I discussed the limitations of evaluation and management by telemedicine and the availability of in person appointments. The patient expressed understanding and agreed to proceed.  Visit Date: 08/06/2021  Today's healthcare provider: Dulce Sellar, NP     Subjective:    Patient ID: Diana Case, female    DOB: 09/13/1955, 66 y.o.   MRN: [id number]  Chief Complaint  Patient presents with   Covid Positive    Pt tested positive for Covid last night with a at home COVID test. Symptoms include a sore throat ,body aches/chills, Headaches and not able to taste. Has tried ibuprofen and tylenol alternating, which did help with the headache and body aches.     HPI Upper Respiratory Infection: Symptoms include achiness, headache described as all over, low grade fever, non productive cough, and sore throat, loss sense of taste and smell. Onset of symptoms was 2 days ago, gradually worsening since that time. She is drinking moderate amounts of fluids. Evaluation to date: none. Treatment to date: tylenol, Ibuprofen OTC.  Assessment & Plan:   Problem List Items Addressed This Visit   None Visit Diagnoses     COVID-19    -  Primary per home test - Sending Paxlovid, pt advised of FDA label for emergency use, how to  take, & SE. Advised of CDC guidelines for masking. Encouraged to monitor & notify office of any worsening symptoms: increased shortness of breath, weakness, and signs of dehydration. Instructed to rest and hydrate well, ok to continue Tylenol and/or Ibuprofen prn.     Relevant Medications   nirmatrelvir/ritonavir EUA (PAXLOVID) 20 x 150 MG & 10 x 100MG  TABS      Past Medical History:  Diagnosis Date   Dysrhythmia    palpitations   Hypertension    MVP (mitral valve prolapse)     Past Surgical History:  Procedure Laterality Date   CESAREAN SECTION     DIAGNOSTIC LAPAROSCOPY     LAPAROSCOPIC ASSISTED VAGINAL HYSTERECTOMY N/A 12/26/2013   Procedure: LAPAROSCOPIC ASSISTED VAGINAL HYSTERECTOMY;  Surgeon: 12/28/2013, DO;  Location: [facility];  Service: Gynecology;  Laterality: N/A;    Outpatient Medications Prior to Visit  Medication Sig Dispense Refill   acetaminophen (TYLENOL) 500 MG tablet Take 1,000 mg by mouth every 6 (six) hours as needed.     amLODipine (NORVASC) 5 MG tablet TAKE 1 TABLET(5 MG) BY MOUTH DAILY 90 tablet 1   buPROPion (WELLBUTRIN XL) 150 MG 24 hr tablet TAKE 1 TABLET(150 MG) BY MOUTH DAILY 90 tablet 1   ibuprofen (ADVIL,MOTRIN) 200 MG tablet Take 400 mg by mouth as needed.     Melatonin 10 MG TABS Take by mouth.     No facility-administered medications prior to visit.    Allergies  Allergen Reactions   Adhesive [Tape] Rash  Objective:     Physical Exam Vitals and nursing note reviewed.  Constitutional:      General: She is not in acute distress.    Appearance: Normal appearance.  HENT:     Head: Normocephalic.  Pulmonary:     Effort: No respiratory distress.  Musculoskeletal:     Cervical back: Normal range of motion.  Skin:    General: Skin is dry.     Coloration: Skin is not pale.  Neurological:     Mental Status: She is alert and oriented to person, place, and time.  Psychiatric:        Mood and Affect: Mood normal.   Ht 5\' 3"   (1.6 m)   Wt 133 lb 8 oz (60.6 kg)   BMI 23.65 kg/m   Wt Readings from Last 3 Encounters:  08/06/21 133 lb 8 oz (60.6 kg)  01/27/21 133 lb 12.8 oz (60.7 kg)  01/21/20 130 lb 9.6 oz (59.2 kg)        I discussed the assessment and treatment plan with the patient. The patient was provided an opportunity to ask questions and all were answered. The patient agreed with the plan and demonstrated an understanding of the instructions.   The patient was advised to call back or seek an in-person evaluation if the symptoms worsen or if the condition fails to improve as anticipated.  I provided 22 minutes of face-to-face time during this encounter.  01/23/20, NP [facility] [phone] (phone) [phone] (fax)  [facility]

## 2021-10-23 ENCOUNTER — Encounter: Payer: Self-pay | Admitting: Family Medicine

## 2021-10-29 ENCOUNTER — Encounter: Payer: Self-pay | Admitting: Family Medicine

## 2021-10-29 ENCOUNTER — Ambulatory Visit (INDEPENDENT_AMBULATORY_CARE_PROVIDER_SITE_OTHER): Payer: PPO | Admitting: Family Medicine

## 2021-10-29 VITALS — BP 100/62 | HR 88 | Temp 98.1°F | Ht 63.0 in | Wt 133.8 lb

## 2021-10-29 DIAGNOSIS — I10 Essential (primary) hypertension: Secondary | ICD-10-CM

## 2021-10-29 DIAGNOSIS — R5383 Other fatigue: Secondary | ICD-10-CM

## 2021-10-29 DIAGNOSIS — E785 Hyperlipidemia, unspecified: Secondary | ICD-10-CM | POA: Diagnosis not present

## 2021-10-29 LAB — COMPREHENSIVE METABOLIC PANEL
ALT: 18 U/L (ref 0–35)
AST: 19 U/L (ref 0–37)
Albumin: 4.4 g/dL (ref 3.5–5.2)
Alkaline Phosphatase: 73 U/L (ref 39–117)
BUN: 14 mg/dL (ref 6–23)
CO2: 27 mEq/L (ref 19–32)
Calcium: 9.9 mg/dL (ref 8.4–10.5)
Chloride: 101 mEq/L (ref 96–112)
Creatinine, Ser: 0.55 mg/dL (ref 0.40–1.20)
GFR: 94.06 mL/min (ref >60.00)
Glucose, Bld: 93 mg/dL (ref 70–99)
Potassium: 4.1 mEq/L (ref 3.5–5.1)
Sodium: 139 mEq/L (ref 135–145)
Total Bilirubin: 0.4 mg/dL (ref 0.2–1.2)
Total Protein: 7.6 g/dL (ref 6.0–8.3)

## 2021-10-29 LAB — VITAMIN B12: Vitamin B-12: 317 pg/mL (ref 211–911)

## 2021-10-29 LAB — CBC
HCT: 39.6 % (ref 36.0–46.0)
Hemoglobin: 13.4 g/dL (ref 12.0–15.0)
MCHC: 33.7 g/dL (ref 30.0–36.0)
MCV: 89.1 fl (ref 78.0–100.0)
Platelets: 309 10*3/uL (ref 150.0–400.0)
RBC: 4.44 Mil/uL (ref 3.87–5.11)
RDW: 14.1 % (ref 11.5–15.5)
WBC: 5.9 10*3/uL (ref 4.0–10.5)

## 2021-10-29 LAB — VITAMIN D 25 HYDROXY (VIT D DEFICIENCY, FRACTURES): VITD: 24.71 ng/mL — ABNORMAL LOW (ref 30.00–100.00)

## 2021-10-29 LAB — TSH: TSH: 1.14 u[IU]/mL (ref 0.35–5.50)

## 2021-10-29 NOTE — Assessment & Plan Note (Signed)
She will come back soon for CPE and we can check lipids at that time. 

## 2021-10-29 NOTE — Assessment & Plan Note (Signed)
Low today in her home mornings have been low.  This could be contributing to her fatigue.  She was on amlodipine 5 mg daily I will stop this today to see if this helps with her fatigue levels.  She will continue to monitor at home and check with [place] in a few weeks.

## 2021-10-29 NOTE — Progress Notes (Signed)
   Diana Case is a 66 y.o. female who presents today for an office visit.  Assessment/Plan:  New/Acute Problems: Other Fatigue Possibly related to her low blood pressures.  We will stop amlodipine as below.  It is also possible this could be due to [place] however we will check labs today including CBC, c-Met, TSH, and B12 to look for other possible etiologies.  If symptoms persist despite above work-up would consider sleep study.  Chronic Problems Addressed Today: Essential hypertension Low today in her home mornings have been low.  This could be contributing to her fatigue.  She was on amlodipine 5 mg daily I will stop this today to see if this helps with her fatigue levels.  She will continue to monitor at home and check with [place] in a few weeks.  Dyslipidemia She will come back soon for CPE and we can check lipids at that time.      Subjective:  HPI:  Patient here with fatigue. Started about 2 months ago after getting covid.  She feels fine in the morning however feels like she has a lot in the afternoon.  She initially thought this was due to [place] however over the last few weeks she has been checking her blood pressures at home in the afternoon and she has noticed that the readings have been low into the 90s to 100s over 50s to 60s.  No fevers or chills.  No unintentional weight loss.  Feels like she is sleeping well.  She stopped drinking last year.        Objective:  Physical Exam: BP 100/62   Pulse 88   Temp 98.1 F (36.7 C)   Ht _0  (1.6 m)   Wt 133 lb 12.8 oz (60.7 kg)   SpO2 96%   BMI 23.70 kg/m   Wt Readings from Last 3 Encounters:  10/29/21 133 lb 12.8 oz (60.7 kg)  08/06/21 133 lb 8 oz (60.6 kg)  01/27/21 133 lb 12.8 oz (60.7 kg)     Gen: No acute distress, resting comfortably CV: Regular rate and rhythm with no murmurs appreciated Pulm: Normal work of breathing, clear to auscultation bilaterally with no crackles, wheezes, or rhonchi Neuro:  Grossly normal, moves all extremities Psych: Normal affect and thought content      Tevion Laforge M. Jerline Pain, MD 10/29/2021 10:02 AM

## 2021-10-29 NOTE — Patient Instructions (Signed)
It was very nice to see you today!  Please stop the amlodipine.  We will check blood work today to make sure there is nothing else that could be causing your fatigue.  Please send a message in a few weeks to let me know how your blood pressures are looking.  Take care, Dr [name]  PLEASE NOTE:  If you had any lab tests please let us know if you have not heard back within a few days. You may see your results on mychart before we have a chance to review them but we will give you a call once they are reviewed by [place]. If we ordered any referrals today, please let us know if you have not heard from their office within the next week.   Please try these tips to maintain a healthy lifestyle:  Eat at least 3 REAL meals and 1-2 snacks per day.  Aim for no more than 5 hours between eating.  If you eat breakfast, please do so within one hour of getting up.   Each meal should contain half fruits/vegetables, one quarter protein, and one quarter carbs (no bigger than a computer mouse)  Cut down on sweet beverages. This includes juice, soda, and sweet tea.   Drink at least 1 glass of water with each meal and aim for at least 8 glasses per day  Exercise at least 150 minutes every week.

## 2021-11-03 NOTE — Progress Notes (Signed)
Please inform patient of the following:  Vitamin D is a little low.  This could explain some of her symptoms.  Recommend starting 50,000 IUs once weekly.  We will send a new prescription.  We should recheck again in 3 to 6 months.  Thing else is normal.

## 2021-11-11 ENCOUNTER — Other Ambulatory Visit: Payer: Self-pay | Admitting: *Deleted

## 2021-11-11 MED ORDER — VITAMIN D (ERGOCALCIFEROL) 1.25 MG (50000 UNIT) PO CAPS: 50000.0000 [IU] | ORAL_CAPSULE | ORAL | 0 refills | Status: DC

## 2021-11-19 ENCOUNTER — Encounter: Payer: Self-pay | Admitting: Family Medicine

## 2021-11-20 NOTE — Telephone Encounter (Signed)
I appreicate the update. She can continue to monitor at home and let us know if she has any further issues.  Algis Greenhouse. Jerline Pain, MD 11/20/2021 12:06 PM

## 2021-11-23 ENCOUNTER — Encounter: Payer: Self-pay | Admitting: *Deleted

## 2022-01-18 ENCOUNTER — Other Ambulatory Visit: Payer: Self-pay | Admitting: Family Medicine

## 2022-01-28 ENCOUNTER — Ambulatory Visit (INDEPENDENT_AMBULATORY_CARE_PROVIDER_SITE_OTHER): Payer: PPO | Admitting: Family Medicine

## 2022-01-28 ENCOUNTER — Encounter: Payer: Self-pay | Admitting: Family Medicine

## 2022-01-28 VITALS — BP 100/68 | HR 78 | Temp 97.7°F | Ht 63.0 in | Wt 141.6 lb

## 2022-01-28 DIAGNOSIS — E785 Hyperlipidemia, unspecified: Secondary | ICD-10-CM

## 2022-01-28 DIAGNOSIS — I10 Essential (primary) hypertension: Secondary | ICD-10-CM

## 2022-01-28 DIAGNOSIS — F325 Major depressive disorder, single episode, in full remission: Secondary | ICD-10-CM | POA: Diagnosis not present

## 2022-01-28 DIAGNOSIS — E559 Vitamin D deficiency, unspecified: Secondary | ICD-10-CM | POA: Diagnosis not present

## 2022-01-28 DIAGNOSIS — Z23 Encounter for immunization: Secondary | ICD-10-CM

## 2022-01-28 DIAGNOSIS — R739 Hyperglycemia, unspecified: Secondary | ICD-10-CM | POA: Diagnosis not present

## 2022-01-28 DIAGNOSIS — Z0001 Encounter for general adult medical examination with abnormal findings: Secondary | ICD-10-CM

## 2022-01-28 DIAGNOSIS — Z1211 Encounter for screening for malignant neoplasm of colon: Secondary | ICD-10-CM

## 2022-01-28 LAB — LIPID PANEL
Cholesterol: 290 mg/dL — ABNORMAL HIGH (ref 0–200)
HDL: 100.1 mg/dL (ref >39.00)
LDL Cholesterol: 164 mg/dL — ABNORMAL HIGH (ref 0–99)
NonHDL: 189.45
Total CHOL/HDL Ratio: 3
Triglycerides: 127 mg/dL (ref 0.0–149.0)
VLDL: 25.4 mg/dL (ref 0.0–40.0)

## 2022-01-28 LAB — COMPREHENSIVE METABOLIC PANEL
ALT: 18 U/L (ref 0–35)
AST: 16 U/L (ref 0–37)
Albumin: 4.6 g/dL (ref 3.5–5.2)
Alkaline Phosphatase: 67 U/L (ref 39–117)
BUN: 10 mg/dL (ref 6–23)
CO2: 29 mEq/L (ref 19–32)
Calcium: 9.6 mg/dL (ref 8.4–10.5)
Chloride: 100 mEq/L (ref 96–112)
Creatinine, Ser: 0.52 mg/dL (ref 0.40–1.20)
GFR: 95.17 mL/min (ref >60.00)
Glucose, Bld: 100 mg/dL — ABNORMAL HIGH (ref 70–99)
Potassium: 4.7 mEq/L (ref 3.5–5.1)
Sodium: 139 mEq/L (ref 135–145)
Total Bilirubin: 0.7 mg/dL (ref 0.2–1.2)
Total Protein: 7.2 g/dL (ref 6.0–8.3)

## 2022-01-28 LAB — CBC
HCT: 42.6 % (ref 36.0–46.0)
Hemoglobin: 14.3 g/dL (ref 12.0–15.0)
MCHC: 33.6 g/dL (ref 30.0–36.0)
MCV: 90.5 fl (ref 78.0–100.0)
Platelets: 303 10*3/uL (ref 150.0–400.0)
RBC: 4.71 Mil/uL (ref 3.87–5.11)
RDW: 14.4 % (ref 11.5–15.5)
WBC: 5.4 10*3/uL (ref 4.0–10.5)

## 2022-01-28 LAB — TSH: TSH: 1.1 u[IU]/mL (ref 0.35–5.50)

## 2022-01-28 LAB — HEMOGLOBIN A1C: Hgb A1c MFr Bld: 5.8 % (ref 4.6–6.5)

## 2022-01-28 NOTE — Assessment & Plan Note (Signed)
Doing well Wellbutrin 150 mg daily.

## 2022-01-28 NOTE — Assessment & Plan Note (Signed)
At goal today off meds. 

## 2022-01-28 NOTE — Progress Notes (Signed)
Chief Complaint:  Diana Case is a 66 y.o. female who presents today for her annual comprehensive physical exam.    Assessment/Plan:  Chronic Problems Addressed Today: Avitaminosis D On the below last blood draw.  She is doing much better with replacement 50,000 IUs once weekly.  We will recheck vitamin D level today.  Essential hypertension At goal today off meds.  Dyslipidemia Check lipids.  Discussed lifestyle modifications.  Depression, major, single episode, complete remission (HCC) Doing well Wellbutrin 150 mg daily.  Preventative Healthcare: Check labs.  Flu shot given today.  She will go to pharmacy for shingles vaccine.  Patient does report she did Cologuard last year however never received results and we do not have any results available in [place] either.  We will contact the company to see if they have the results.  If this was never processed we will need to repeat Cologuard testing.  She will be getting mammogram done soon.  Patient Counseling(The following topics were reviewed and/or handout was given):  -Nutrition: Stressed importance of moderation in sodium/caffeine intake, saturated fat and cholesterol, caloric balance, sufficient intake of fresh fruits, vegetables, and fiber.  -Stressed the importance of regular exercise.   -Substance Abuse: Discussed cessation/primary prevention of tobacco, alcohol, or other drug use; driving or other dangerous activities under the influence; availability of treatment for abuse.   -Injury prevention: Discussed safety belts, safety helmets, smoke detector, smoking near bedding or upholstery.   -Sexuality: Discussed sexually transmitted diseases, partner selection, use of condoms, avoidance of unintended pregnancy and contraceptive alternatives.   -Dental health: Discussed importance of regular tooth brushing, flossing, and dental visits.  -Health maintenance and immunizations reviewed. Please refer to Health maintenance  section.  Return to care in 1 year for next preventative visit.     Subjective:  HPI:  She has no acute complaints today.   Lifestyle Diet: Balanced. Plenty of fruits and vegetables.  Exercise: None specific. Tries to get more walking.      01/28/2022    8:35 AM  Depression screen PHQ 2/9  Decreased Interest 0  Down, Depressed, Hopeless 0  PHQ - 2 Score 0  Altered sleeping 0  Tired, decreased energy 0  Change in appetite 0  Feeling bad or failure about yourself  0  Trouble concentrating 0  Moving slowly or fidgety/restless 0  Suicidal thoughts 0  PHQ-9 Score 0  Difficult doing work/chores Not difficult at all    Health Maintenance Due  Topic Date Due   Medicare Annual Wellness (AWV)  Never done   Fecal DNA (Cologuard)  Never done     ROS: Per HPI, otherwise a complete review of systems was negative.   PMH:  The following were reviewed and entered/updated in epic: Past Medical History:  Diagnosis Date   Dysrhythmia    palpitations   Hypertension    MVP (mitral valve prolapse)    Patient Active Problem List   Diagnosis Date Noted   Avitaminosis D 01/28/2022   Dyslipidemia 12/27/2017   Chronic pain of left knee 05/20/2017   Essential hypertension 02/17/2017   Depression, major, single episode, complete remission ([place]) 02/17/2017   Retinal detachment, left 01/06/2012   Retinal tear 01/06/2012   Status post intraocular lens implant 01/06/2012   Recurrent HSV (herpes simplex virus) 08/27/2011   Past Surgical History:  Procedure Laterality Date   CESAREAN SECTION     DIAGNOSTIC LAPAROSCOPY     LAPAROSCOPIC ASSISTED VAGINAL HYSTERECTOMY N/A 12/26/2013   Procedure: LAPAROSCOPIC  ASSISTED VAGINAL HYSTERECTOMY;  Surgeon: Sharon Seller, DO;  Location: [facility];  Service: Gynecology;  Laterality: N/A;    History reviewed. No pertinent family history.  Medications- reviewed and updated Current Outpatient Medications  Medication Sig Dispense Refill    acetaminophen (TYLENOL) 500 MG tablet Take 1,000 mg by mouth every 6 (six) hours as needed.     Biotin 5000 MCG SUBL Place under the tongue.     buPROPion (WELLBUTRIN XL) 150 MG 24 hr tablet TAKE 1 TABLET(150 MG) BY MOUTH DAILY 90 tablet 1   ibuprofen (ADVIL,MOTRIN) 200 MG tablet Take 400 mg by mouth as needed.     Melatonin 10 MG TABS Take by mouth.     Vitamin D, Ergocalciferol, (DRISDOL) 1.25 MG (50000 UNIT) CAPS capsule Take 1 capsule (50,000 Units total) by mouth every 7 (seven) days. 12 capsule 0   No current facility-administered medications for this visit.    Allergies-reviewed and updated Allergies  Allergen Reactions   Adhesive [Tape] Rash    Social History   Socioeconomic History   Marital status: Married    Spouse name: Not on file   Number of children: Not on file   Years of education: Not on file   Highest education level: Not on file  Occupational History   Not on file  Tobacco Use   Smoking status: Never   Smokeless tobacco: Never  Substance and Sexual Activity   Alcohol use: Yes    Comment: daily   Drug use: No   Sexual activity: Not on file  Other Topics Concern   Not on file  Social History Narrative   Not on file   Social Determinants of Health   Financial Resource Strain: Not on file  Food Insecurity: Not on file  Transportation Needs: Not on file  Physical Activity: Not on file  Stress: Not on file  Social Connections: Not on file        Objective:  Physical Exam: BP 100/68   Pulse 78   Temp 97.7 F (36.5 C) (Temporal)   Ht 5\' 3"  (1.6 m)   Wt 141 lb 9.6 oz (64.2 kg)   SpO2 99%   BMI 25.08 kg/m   Body mass index is 25.08 kg/m. Wt Readings from Last 3 Encounters:  01/28/22 141 lb 9.6 oz (64.2 kg)  10/29/21 133 lb 12.8 oz (60.7 kg)  08/06/21 133 lb 8 oz (60.6 kg)   Gen: NAD, resting comfortably HEENT: TMs normal bilaterally. OP clear. No thyromegaly noted.  CV: RRR with no murmurs appreciated Pulm: NWOB, CTAB with no crackles,  wheezes, or rhonchi GI: Normal bowel sounds present. Soft, Nontender, Nondistended. MSK: no edema, cyanosis, or clubbing noted Skin: warm, dry Neuro: CN2-12 grossly intact. Strength 5/5 in upper and lower extremities. Reflexes symmetric and intact bilaterally.  Psych: Normal affect and thought content     Ravinder Lukehart M. 10/06/21, MD 01/28/2022 9:04 AM

## 2022-01-28 NOTE — Assessment & Plan Note (Signed)
On the below last blood draw.  She is doing much better with replacement 50,000 IUs once weekly.  We will recheck vitamin D level today.

## 2022-01-28 NOTE — Patient Instructions (Signed)
It was very nice to see you today!  We will check blood work today.  I am glad that you are feeling better.  We will check to see if we can find the results on your cologuard.  We may need to have you complete this again if we do not have the results.  We will see you back in year for your next physical.  Please come back to see me sooner if needed.  Take care, Dr Jerline Pain  PLEASE NOTE:  If you had any lab tests please let us know if you have not heard back within a few days. You may see your results on mychart before we have a chance to review them but we will give you a call once they are reviewed by [place]. If we ordered any referrals today, please let us know if you have not heard from their office within the next week.   Please try these tips to maintain a healthy lifestyle:  Eat at least 3 REAL meals and 1-2 snacks per day.  Aim for no more than 5 hours between eating.  If you eat breakfast, please do so within one hour of getting up.   Each meal should contain half fruits/vegetables, one quarter protein, and one quarter carbs (no bigger than a computer mouse)  Cut down on sweet beverages. This includes juice, soda, and sweet tea.   Drink at least 1 glass of water with each meal and aim for at least 8 glasses per day  Exercise at least 150 minutes every week.   Preventive Care 65 Years and Older, Female Preventive care refers to lifestyle choices and visits with your health care provider that can promote health and wellness. Preventive care visits are also called wellness exams. What can I expect for my preventive care visit? Counseling Your health care provider may ask you questions about your: Medical history, including: Past medical problems. Family medical history. Pregnancy and menstrual history. History of falls. Current health, including: Memory and ability to understand (cognition). Emotional well-being. Home life and relationship well-being. Sexual activity and  sexual health. Lifestyle, including: Alcohol, nicotine or tobacco, and drug use. Access to firearms. Diet, exercise, and sleep habits. Work and work Statistician. Sunscreen use. Safety issues such as seatbelt and bike helmet use. Physical exam Your health care provider will check your: Height and weight. These may be used to calculate your BMI (body mass index). BMI is a measurement that tells if you are at a healthy weight. Waist circumference. This measures the distance around your waistline. This measurement also tells if you are at a healthy weight and may help predict your risk of certain diseases, such as type 2 diabetes and high blood pressure. Heart rate and blood pressure. Body temperature. Skin for abnormal spots. What immunizations do I need?  Vaccines are usually given at various ages, according to a schedule. Your health care provider will recommend vaccines for you based on your age, medical history, and lifestyle or other factors, such as travel or where you work. What tests do I need? Screening Your health care provider may recommend screening tests for certain conditions. This may include: Lipid and cholesterol levels. Hepatitis C test. Hepatitis B test. HIV (human immunodeficiency virus) test. STI (sexually transmitted infection) testing, if you are at risk. Lung cancer screening. Colorectal cancer screening. Diabetes screening. This is done by checking your blood sugar (glucose) after you have not eaten for a while (fasting). Mammogram. Talk with your health care provider  about how often you should have regular mammograms. BRCA-related cancer screening. This may be done if you have a family history of breast, ovarian, tubal, or peritoneal cancers. Bone density scan. This is done to screen for osteoporosis. Talk with your health care provider about your test results, treatment options, and if necessary, the need for more tests. Follow these instructions at  home: Eating and drinking  Eat a diet that includes fresh fruits and vegetables, whole grains, lean protein, and low-fat dairy products. Limit your intake of foods with high amounts of sugar, saturated fats, and salt. Take vitamin and mineral supplements as recommended by your health care provider. Do not drink alcohol if your health care provider tells you not to drink. If you drink alcohol: Limit how much you have to 0-1 drink a day. Know how much alcohol is in your drink. In the [place] one drink equals one 12 oz bottle of beer (355 mL), one 5 oz glass of wine (148 mL), or one 1 oz glass of hard liquor (44 mL). Lifestyle Brush your teeth every morning and night with fluoride toothpaste. Floss one time each day. Exercise for at least 30 minutes 5 or more days each week. Do not use any products that contain nicotine or tobacco. These products include cigarettes, chewing tobacco, and vaping devices, such as e-cigarettes. If you need help quitting, ask your health care provider. Do not use drugs. If you are sexually active, practice safe sex. Use a condom or other form of protection in order to prevent STIs. Take aspirin only as told by your health care provider. Make sure that you understand how much to take and what form to take. Work with your health care provider to find out whether it is safe and beneficial for you to take aspirin daily. Ask your health care provider if you need to take a cholesterol-lowering medicine (statin). Find healthy ways to manage stress, such as: Meditation, yoga, or listening to music. Journaling. Talking to a trusted person. Spending time with friends and family. Minimize exposure to UV radiation to reduce your risk of skin cancer. Safety Always wear your seat belt while driving or riding in a vehicle. Do not drive: If you have been drinking alcohol. Do not ride with someone who has been drinking. When you are tired or distracted. While texting. If you have  been using any mind-altering substances or drugs. Wear a helmet and other protective equipment during sports activities. If you have firearms in your house, make sure you follow all gun safety procedures. What's next? Visit your health care provider once a year for an annual wellness visit. Ask your health care provider how often you should have your eyes and teeth checked. Stay up to date on all vaccines. This information is not intended to replace advice given to you by your health care provider. Make sure you discuss any questions you have with your health care provider. Document Revised: 08/13/2020 Document Reviewed: 08/13/2020 Elsevier Patient Education  [place].

## 2022-01-28 NOTE — Assessment & Plan Note (Signed)
Check lipids. Discussed lifestyle modifications.  

## 2022-01-29 NOTE — Progress Notes (Signed)
Please inform patient of the following:  Cholesterol and blood sugar both elevated but better than last year.  Everything else is normal.  Do not need to make any changes to her treatment plan at this time.  She should continue to work on diet and exercise and we can recheck in a year.

## 2022-02-04 ENCOUNTER — Other Ambulatory Visit: Payer: Self-pay | Admitting: Family Medicine

## 2022-02-10 DIAGNOSIS — Z1231 Encounter for screening mammogram for malignant neoplasm of breast: Secondary | ICD-10-CM | POA: Diagnosis not present

## 2022-02-10 LAB — HM MAMMOGRAPHY

## 2022-02-12 ENCOUNTER — Encounter: Payer: Self-pay | Admitting: Family Medicine

## 2022-03-19 DIAGNOSIS — Z961 Presence of intraocular lens: Secondary | ICD-10-CM | POA: Diagnosis not present

## 2022-03-19 DIAGNOSIS — H35372 Puckering of macula, left eye: Secondary | ICD-10-CM | POA: Diagnosis not present

## 2022-03-19 DIAGNOSIS — H524 Presbyopia: Secondary | ICD-10-CM | POA: Diagnosis not present

## 2022-03-19 DIAGNOSIS — H3322 Serous retinal detachment, left eye: Secondary | ICD-10-CM | POA: Diagnosis not present

## 2022-03-19 DIAGNOSIS — H5203 Hypermetropia, bilateral: Secondary | ICD-10-CM | POA: Diagnosis not present

## 2022-03-19 DIAGNOSIS — H52203 Unspecified astigmatism, bilateral: Secondary | ICD-10-CM | POA: Diagnosis not present

## 2022-04-18 ENCOUNTER — Other Ambulatory Visit: Payer: Self-pay | Admitting: Family Medicine

## 2022-04-19 ENCOUNTER — Other Ambulatory Visit: Payer: Self-pay

## 2022-04-19 DIAGNOSIS — E559 Vitamin D deficiency, unspecified: Secondary | ICD-10-CM

## 2022-04-19 DIAGNOSIS — E538 Deficiency of other specified B group vitamins: Secondary | ICD-10-CM

## 2022-04-29 ENCOUNTER — Other Ambulatory Visit (INDEPENDENT_AMBULATORY_CARE_PROVIDER_SITE_OTHER): Payer: PPO

## 2022-04-29 DIAGNOSIS — E538 Deficiency of other specified B group vitamins: Secondary | ICD-10-CM | POA: Diagnosis not present

## 2022-04-29 DIAGNOSIS — E559 Vitamin D deficiency, unspecified: Secondary | ICD-10-CM

## 2022-04-29 LAB — VITAMIN D 25 HYDROXY (VIT D DEFICIENCY, FRACTURES): VITD: 28.04 ng/mL — ABNORMAL LOW (ref 30.00–100.00)

## 2022-04-29 LAB — VITAMIN B12: Vitamin B-12: 528 pg/mL (ref 211–911)

## 2022-04-30 NOTE — Progress Notes (Signed)
Please inform patient of the following:  Her B12 and vitamin are both better than last time however her vitamin D is still not quite at goal.  Recommend that she take at least 2000 IUs of vitamin D daily and we can recheck again in 3-6 months.

## 2022-05-03 ENCOUNTER — Other Ambulatory Visit: Payer: Self-pay

## 2022-05-03 DIAGNOSIS — E538 Deficiency of other specified B group vitamins: Secondary | ICD-10-CM

## 2022-05-03 DIAGNOSIS — E559 Vitamin D deficiency, unspecified: Secondary | ICD-10-CM

## 2022-06-15 ENCOUNTER — Telehealth: Payer: Self-pay | Admitting: Family Medicine

## 2022-06-15 NOTE — Telephone Encounter (Signed)
Contacted Diana Case to schedule their annual wellness visit. Appointment made for 06/28/2022.  [facility] TEAM Direct Dial [phone]

## 2022-06-28 ENCOUNTER — Ambulatory Visit (INDEPENDENT_AMBULATORY_CARE_PROVIDER_SITE_OTHER): Payer: PPO

## 2022-06-28 VITALS — Wt 141.0 lb

## 2022-06-28 DIAGNOSIS — Z0000 Encounter for general adult medical examination without abnormal findings: Secondary | ICD-10-CM | POA: Diagnosis not present

## 2022-06-28 NOTE — Patient Instructions (Signed)
Diana Case , Thank you for taking time to come for your Medicare Wellness Visit. I appreciate your ongoing commitment to your health goals. Please review the following plan we discussed and let me know if I can assist you in the future.   These are the goals we discussed:  Goals      Patient Stated     Lose weight         This is a list of the screening recommended for you and due dates:  Health Maintenance  Topic Date Due   Cologuard (Stool DNA test)  Never done   Zoster (Shingles) Vaccine (1 of 2) Never done   COVID-19 Vaccine (5 - 2023-24 season) 10/30/2021   Flu Shot  09/30/2022   Medicare Annual Wellness Visit  06/28/2023   Mammogram  02/11/2024   Pneumonia Vaccine  Completed   DEXA scan (bone density measurement)  Completed   Hepatitis C Screening: USPSTF Recommendation to screen - Ages 18-79 yo.  Completed   HPV Vaccine  Aged Out   DTaP/Tdap/Td vaccine  Discontinued    Advanced directives: Advance directive discussed with you today. Even though you declined this today please call our office should you change your mind and we can give you the proper paperwork for you to fill out.  Conditions/risks identified: lose weight   Next appointment: Follow up in one year for your annual wellness visit    Preventive Care 65 Years and Older, Female Preventive care refers to lifestyle choices and visits with your health care provider that can promote health and wellness. What does preventive care include? A yearly physical exam. This is also called an annual well check. Dental exams once or twice a year. Routine eye exams. Ask your health care provider how often you should have your eyes checked. Personal lifestyle choices, including: Daily care of your teeth and gums. Regular physical activity. Eating a healthy diet. Avoiding tobacco and drug use. Limiting alcohol use. Practicing safe sex. Taking low-dose aspirin every day. Taking vitamin and mineral supplements as  recommended by your health care provider. What happens during an annual well check? The services and screenings done by your health care provider during your annual well check will depend on your age, overall health, lifestyle risk factors, and family history of disease. Counseling  Your health care provider may ask you questions about your: Alcohol use. Tobacco use. Drug use. Emotional well-being. Home and relationship well-being. Sexual activity. Eating habits. History of falls. Memory and ability to understand (cognition). Work and work Astronomer. Reproductive health. Screening  You may have the following tests or measurements: Height, weight, and BMI. Blood pressure. Lipid and cholesterol levels. These may be checked every 5 years, or more frequently if you are over 72 years old. Skin check. Lung cancer screening. You may have this screening every year starting at age 50 if you have a 30-pack-year history of smoking and currently smoke or have quit within the past 15 years. Fecal occult blood test (FOBT) of the stool. You may have this test every year starting at age 68. Flexible sigmoidoscopy or colonoscopy. You may have a sigmoidoscopy every 5 years or a colonoscopy every 10 years starting at age 32. Hepatitis C blood test. Hepatitis B blood test. Sexually transmitted disease (STD) testing. Diabetes screening. This is done by checking your blood sugar (glucose) after you have not eaten for a while (fasting). You may have this done every 1-3 years. Bone density scan. This is done to screen  for osteoporosis. You may have this done starting at age 36. Mammogram. This may be done every 1-2 years. Talk to your health care provider about how often you should have regular mammograms. Talk with your health care provider about your test results, treatment options, and if necessary, the need for more tests. Vaccines  Your health care provider may recommend certain vaccines, such  as: Influenza vaccine. This is recommended every year. Tetanus, diphtheria, and acellular pertussis (Tdap, Td) vaccine. You may need a Td booster every 10 years. Zoster vaccine. You may need this after age 63. Pneumococcal 13-valent conjugate (PCV13) vaccine. One dose is recommended after age 30. Pneumococcal polysaccharide (PPSV23) vaccine. One dose is recommended after age 36. Talk to your health care provider about which screenings and vaccines you need and how often you need them. This information is not intended to replace advice given to you by your health care provider. Make sure you discuss any questions you have with your health care provider. Document Released: 03/14/2015 Document Revised: 11/05/2015 Document Reviewed: 12/17/2014 Elsevier Interactive Patient Education  2017 [place] Prevention in the Home Falls can cause injuries. They can happen to people of all ages. There are many things you can do to make your home safe and to help prevent falls. What can I do on the outside of my home? Regularly fix the edges of walkways and driveways and fix any cracks. Remove anything that might make you trip as you walk through a door, such as a raised step or threshold. Trim any bushes or trees on the path to your home. Use bright outdoor lighting. Clear any walking paths of anything that might make someone trip, such as rocks or tools. Regularly check to see if handrails are loose or broken. Make sure that both sides of any steps have handrails. Any raised decks and porches should have guardrails on the edges. Have any leaves, snow, or ice cleared regularly. Use sand or salt on walking paths during winter. Clean up any spills in your garage right away. This includes oil or grease spills. What can I do in the bathroom? Use night lights. Install grab bars by the toilet and in the tub and shower. Do not use towel bars as grab bars. Use non-skid mats or decals in the tub or  shower. If you need to sit down in the shower, use a plastic, non-slip stool. Keep the floor dry. Clean up any water that spills on the floor as soon as it happens. Remove soap buildup in the tub or shower regularly. Attach bath mats securely with double-sided non-slip rug tape. Do not have throw rugs and other things on the floor that can make you trip. What can I do in the bedroom? Use night lights. Make sure that you have a light by your bed that is easy to reach. Do not use any sheets or blankets that are too big for your bed. They should not hang down onto the floor. Have a firm chair that has side arms. You can use this for support while you get dressed. Do not have throw rugs and other things on the floor that can make you trip. What can I do in the kitchen? Clean up any spills right away. Avoid walking on wet floors. Keep items that you use a lot in easy-to-reach places. If you need to reach something above you, use a strong step stool that has a grab bar. Keep electrical cords out of the way. Do  not use floor polish or wax that makes floors slippery. If you must use wax, use non-skid floor wax. Do not have throw rugs and other things on the floor that can make you trip. What can I do with my stairs? Do not leave any items on the stairs. Make sure that there are handrails on both sides of the stairs and use them. Fix handrails that are broken or loose. Make sure that handrails are as long as the stairways. Check any carpeting to make sure that it is firmly attached to the stairs. Fix any carpet that is loose or worn. Avoid having throw rugs at the top or bottom of the stairs. If you do have throw rugs, attach them to the floor with carpet tape. Make sure that you have a light switch at the top of the stairs and the bottom of the stairs. If you do not have them, ask someone to add them for you. What else can I do to help prevent falls? Wear shoes that: Do not have high heels. Have  rubber bottoms. Are comfortable and fit you well. Are closed at the toe. Do not wear sandals. If you use a stepladder: Make sure that it is fully opened. Do not climb a closed stepladder. Make sure that both sides of the stepladder are locked into place. Ask someone to hold it for you, if possible. Clearly mark and make sure that you can see: Any grab bars or handrails. First and last steps. Where the edge of each step is. Use tools that help you move around (mobility aids) if they are needed. These include: Canes. Walkers. Scooters. Crutches. Turn on the lights when you go into a dark area. Replace any light bulbs as soon as they burn out. Set up your furniture so you have a clear path. Avoid moving your furniture around. If any of your floors are uneven, fix them. If there are any pets around you, be aware of where they are. Review your medicines with your doctor. Some medicines can make you feel dizzy. This can increase your chance of falling. Ask your doctor what other things that you can do to help prevent falls. This information is not intended to replace advice given to you by your health care provider. Make sure you discuss any questions you have with your health care provider. Document Released: 12/12/2008 Document Revised: 07/24/2015 Document Reviewed: 03/22/2014 Elsevier Interactive Patient Education  2017 [employer].

## 2022-06-28 NOTE — Progress Notes (Signed)
I connected with  Diana Case on 06/28/22 by a audio enabled telemedicine application and verified that I am speaking with the correct person using two identifiers.  Patient Location: Home  Provider Location: Office/Clinic  I discussed the limitations of evaluation and management by telemedicine. The patient expressed understanding and agreed to proceed.   Subjective:   Diana Case is a 67 y.o. female who presents for an Initial Medicare Annual Wellness Visit.  Review of Systems     Cardiac Risk Factors include: advanced age (>4men, >70 women);dyslipidemia;hypertension     Objective:    Today's Vitals   06/28/22 0905  Weight: 141 lb (64 kg)   Body mass index is 24.98 kg/m.     06/28/2022    9:10 AM 05/27/2016   11:11 AM 12/26/2013    5:20 PM 12/21/2013    2:29 PM  Advanced Directives  Does Patient Have a Medical Advance Directive? No No No Yes;No  Does patient want to make changes to medical advance directive?   Yes - information given Yes - information given  Would patient like information on creating a medical advance directive? No - Patient declined No - Patient declined Yes - Educational materials given     Current Medications (verified) Outpatient Encounter Medications as of 06/28/2022  Medication Sig   acetaminophen (TYLENOL) 500 MG tablet Take 1,000 mg by mouth every 6 (six) hours as needed.   Biotin 5000 MCG SUBL Place under the tongue.   buPROPion (WELLBUTRIN XL) 150 MG 24 hr tablet TAKE 1 TABLET(150 MG) BY MOUTH DAILY   ibuprofen (ADVIL,MOTRIN) 200 MG tablet Take 400 mg by mouth as needed.   Melatonin 10 MG TABS Take by mouth.   Vitamin D, Ergocalciferol, (DRISDOL) 1.25 MG (50000 UNIT) CAPS capsule TAKE 1 CAPSULE BY MOUTH EVERY 7 DAYS   No facility-administered encounter medications on file as of 06/28/2022.    Allergies (verified) Adhesive [tape]   History: Past Medical History:  Diagnosis Date   Dysrhythmia    palpitations    Hypertension    MVP (mitral valve prolapse)    Past Surgical History:  Procedure Laterality Date   CESAREAN SECTION     DIAGNOSTIC LAPAROSCOPY     LAPAROSCOPIC ASSISTED VAGINAL HYSTERECTOMY N/A 12/26/2013   Procedure: LAPAROSCOPIC ASSISTED VAGINAL HYSTERECTOMY;  Surgeon: Sharon Seller, DO;  Location: [facility];  Service: Gynecology;  Laterality: N/A;   No family history on file. Social History   Socioeconomic History   Marital status: Married    Spouse name: Not on file   Number of children: Not on file   Years of education: Not on file   Highest education level: Not on file  Occupational History   Not on file  Tobacco Use   Smoking status: Never   Smokeless tobacco: Never  Substance and Sexual Activity   Alcohol use: Yes    Comment: daily   Drug use: No   Sexual activity: Not on file  Other Topics Concern   Not on file  Social History Narrative   Not on file   Social Determinants of Health   Financial Resource Strain: Low Risk  (06/28/2022)   Overall Financial Resource Strain (CARDIA)    Difficulty of Paying Living Expenses: Not hard at all  Food Insecurity: No Food Insecurity (06/28/2022)   Hunger Vital Sign    Worried About Running Out of Food in the Last Year: Never true    Ran Out of Food in the Last Year: Never  true  Transportation Needs: No Transportation Needs (06/28/2022)   PRAPARE - Administrator, Civil Service (Medical): No    Lack of Transportation (Non-Medical): No  Physical Activity: Sufficiently Active (06/28/2022)   Exercise Vital Sign    Days of Exercise per Week: 5 days    Minutes of Exercise per Session: 50 min  Stress: No Stress Concern Present (06/28/2022)   [employer] of Occupational Health - Occupational Stress Questionnaire    Feeling of Stress : Only a little  Social Connections: Moderately Integrated (06/28/2022)   Social Connection and Isolation Panel [NHANES]    Frequency of Communication with Friends and Family: More  than three times a week    Frequency of Social Gatherings with Friends and Family: More than three times a week    Attends Religious Services: Never    Database administrator or Organizations: Yes    Attends Engineer, structural: 1 to 4 times per year    Marital Status: Married    Tobacco Counseling Counseling given: Not Answered   Clinical Intake:  Pre-visit preparation completed: Yes  Pain : No/denies pain     BMI - recorded: 24.98 Nutritional Status: BMI of 19-24  Normal Nutritional Risks: None Diabetes: No  How often do you need to have someone help you when you read instructions, pamphlets, or other written materials from your doctor or pharmacy?: 1 - Never  Diabetic?no  Interpreter Needed?: No  Information entered by :: Lanier Ensign, LPN   Activities of Daily Living    06/28/2022    9:11 AM  In your present state of health, do you have any difficulty performing the following activities:  Hearing? 0  Vision? 0  Difficulty concentrating or making decisions? 0  Walking or climbing stairs? 0  Dressing or bathing? 0  Doing errands, shopping? 0  Preparing Food and eating ? N  Using the Toilet? N  In the past six months, have you accidently leaked urine? N  Do you have problems with loss of bowel control? N  Managing your Medications? N  Managing your Finances? N  Housekeeping or managing your Housekeeping? N    Patient Care Team: Ardith Dark, MD as PCP - General (Family Medicine) Mammography, [facility] (Diagnostic Radiology)  Indicate any recent Medical Services you may have received from other than [facility] providers in the past year (date may be approximate).     Assessment:   This is a routine wellness examination for [place].  Hearing/Vision screen Hearing Screening - Comments:: Pt denies any hearing issues  Vision Screening - Comments:: Pt follows up with [facility] for annual eye exams   Dietary issues and exercise activities  discussed: Current Exercise Habits: Home exercise routine, Type of exercise: Other - see comments;treadmill;stretching;strength training/weights;walking, Time (Minutes): 50, Frequency (Times/Week): 5, Weekly Exercise (Minutes/Week): 250   Goals Addressed             This Visit's Progress    Patient Stated       Lose weight        Depression Screen    06/28/2022    9:09 AM 01/28/2022    8:35 AM 08/06/2021    3:53 PM 01/27/2021    8:11 AM 01/21/2020    9:52 AM 01/19/2019    9:51 AM 12/23/2017    8:04 AM  PHQ 2/9 Scores  PHQ - 2 Score 1 0 0 0 0 0 0  PHQ- 9 Score  0 0 0  2  Fall Risk    06/28/2022    9:11 AM 01/28/2022    8:36 AM 10/29/2021    9:35 AM 01/27/2021    8:11 AM 01/21/2020    8:30 AM  Fall Risk   Falls in the past year? 0 0 0 0 0  Number falls in past yr: 0 0 0 0 0  Injury with Fall? 0 0 0 0 0  Risk for fall due to : Impaired vision No Fall Risks No Fall Risks No Fall Risks   Follow up Falls prevention discussed  Falls evaluation completed      FALL RISK PREVENTION PERTAINING TO THE HOME:  Any stairs in or around the home? Yes  If so, are there any without handrails? No  Home free of loose throw rugs in walkways, pet beds, electrical cords, etc? Yes  Adequate lighting in your home to reduce risk of falls? Yes   ASSISTIVE DEVICES UTILIZED TO PREVENT FALLS:  Life alert? No  Use of a cane, walker or w/c? No  Grab bars in the bathroom? No  Shower chair or bench in shower? Yes  Elevated toilet seat or a handicapped toilet? No   TIMED UP AND GO:  Was the test performed? No .    Cognitive Function:        06/28/2022    9:12 AM  6CIT Screen  What Year? 0 points  What month? 0 points  What time? 0 points  Count back from 20 0 points  Months in reverse 0 points  Repeat phrase 0 points  Total Score 0 points    Immunizations Immunization History  Administered Date(s) Administered   Fluad Quad(high Dose 65+) 01/19/2019, 01/27/2021,  01/28/2022   Influenza,inj,Quad PF,6+ Mos 12/27/2013, 02/17/2017, 12/23/2017, 01/21/2020   Moderna Covid-19 Vaccine Bivalent Booster 4yrs & up 01/09/2021   Moderna Sars-Covid-2 Vaccination 04/16/2019, 05/13/2019, 01/01/2020   Pneumococcal Conjugate-13 01/19/2019   Pneumococcal Polysaccharide-23 01/21/2020      Flu Vaccine status: Up to date  Pneumococcal vaccine status: Up to date  Covid-19 vaccine status: Completed vaccines  Qualifies for Shingles Vaccine? Yes   Zostavax completed No   Shingrix Completed?: No.    Education has been provided regarding the importance of this vaccine. Patient has been advised to call insurance company to determine out of pocket expense if they have not yet received this vaccine. Advised may also receive vaccine at local pharmacy or Health Dept. Verbalized acceptance and understanding.  Screening Tests Health Maintenance  Topic Date Due   Fecal DNA (Cologuard)  Never done   Zoster Vaccines- Shingrix (1 of 2) Never done   COVID-19 Vaccine (5 - 2023-24 season) 10/30/2021   INFLUENZA VACCINE  09/30/2022   Medicare Annual Wellness (AWV)  06/28/2023   MAMMOGRAM  02/11/2024   Pneumonia Vaccine 58+ Years old  Completed   DEXA SCAN  Completed   Hepatitis C Screening  Completed   HPV VACCINES  Aged Out   DTaP/Tdap/Td  Discontinued    Health Maintenance  Health Maintenance Due  Topic Date Due   Fecal DNA (Cologuard)  Never done   Zoster Vaccines- Shingrix (1 of 2) Never done   COVID-19 Vaccine (5 - 2023-24 season) 10/30/2021    Cologuard pt has at home   Mammogram status: Completed 02/10/22. Repeat every year  Bone Density status: Completed 01/24/19. Results reflect: Bone density results: OSTEOPENIA. Repeat every 2 years.   Additional Screening:  Hepatitis C Screening:  Completed 02/18/17  Vision Screening: Recommended annual ophthalmology  exams for early detection of glaucoma and other disorders of the eye. Is the patient up to date with  their annual eye exam?  Yes  Who is the provider or what is the name of the office in which the patient attends annual eye exams? [facility] If pt is not established with a provider, would they like to be referred to a provider to establish care? No .   Dental Screening: Recommended annual dental exams for proper oral hygiene  Community Resource Referral / Chronic Care Management: CRR required this visit?  No   CCM required this visit?  No      Plan:     I have personally reviewed and noted the following in the patient's chart:   Medical and social history Use of alcohol, tobacco or illicit drugs  Current medications and supplements including opioid prescriptions. Patient is not currently taking opioid prescriptions. Functional ability and status Nutritional status Physical activity Advanced directives List of other physicians Hospitalizations, surgeries, and ER visits in previous 12 months Vitals Screenings to include cognitive, depression, and falls Referrals and appointments  In addition, I have reviewed and discussed with patient certain preventive protocols, quality metrics, and best practice recommendations. A written personalized care plan for preventive services as well as general preventive health recommendations were provided to patient.     Marzella Schlein, LPN   1/61/0960   Nurse Notes: none

## 2022-07-14 ENCOUNTER — Other Ambulatory Visit: Payer: Self-pay | Admitting: Family Medicine

## 2022-12-30 DIAGNOSIS — F321 Major depressive disorder, single episode, moderate: Secondary | ICD-10-CM | POA: Diagnosis not present

## 2022-12-30 DIAGNOSIS — F4323 Adjustment disorder with mixed anxiety and depressed mood: Secondary | ICD-10-CM | POA: Diagnosis not present

## 2023-01-11 DIAGNOSIS — F4323 Adjustment disorder with mixed anxiety and depressed mood: Secondary | ICD-10-CM | POA: Diagnosis not present

## 2023-01-11 DIAGNOSIS — F321 Major depressive disorder, single episode, moderate: Secondary | ICD-10-CM | POA: Diagnosis not present

## 2023-01-14 ENCOUNTER — Other Ambulatory Visit: Payer: Self-pay | Admitting: Family Medicine

## 2023-02-03 ENCOUNTER — Ambulatory Visit (INDEPENDENT_AMBULATORY_CARE_PROVIDER_SITE_OTHER): Payer: PPO | Admitting: Family Medicine

## 2023-02-03 ENCOUNTER — Encounter: Payer: Self-pay | Admitting: Family Medicine

## 2023-02-03 VITALS — BP 116/81 | HR 82 | Temp 97.5°F | Ht 62.5 in | Wt 135.6 lb

## 2023-02-03 DIAGNOSIS — F321 Major depressive disorder, single episode, moderate: Secondary | ICD-10-CM | POA: Diagnosis not present

## 2023-02-03 DIAGNOSIS — Z23 Encounter for immunization: Secondary | ICD-10-CM | POA: Diagnosis not present

## 2023-02-03 DIAGNOSIS — M6740 Ganglion, unspecified site: Secondary | ICD-10-CM

## 2023-02-03 DIAGNOSIS — F325 Major depressive disorder, single episode, in full remission: Secondary | ICD-10-CM | POA: Diagnosis not present

## 2023-02-03 DIAGNOSIS — Z0001 Encounter for general adult medical examination with abnormal findings: Secondary | ICD-10-CM | POA: Diagnosis not present

## 2023-02-03 DIAGNOSIS — Z1211 Encounter for screening for malignant neoplasm of colon: Secondary | ICD-10-CM | POA: Diagnosis not present

## 2023-02-03 DIAGNOSIS — I10 Essential (primary) hypertension: Secondary | ICD-10-CM | POA: Diagnosis not present

## 2023-02-03 DIAGNOSIS — R739 Hyperglycemia, unspecified: Secondary | ICD-10-CM

## 2023-02-03 DIAGNOSIS — F4323 Adjustment disorder with mixed anxiety and depressed mood: Secondary | ICD-10-CM | POA: Diagnosis not present

## 2023-02-03 DIAGNOSIS — E785 Hyperlipidemia, unspecified: Secondary | ICD-10-CM | POA: Diagnosis not present

## 2023-02-03 DIAGNOSIS — E559 Vitamin D deficiency, unspecified: Secondary | ICD-10-CM | POA: Diagnosis not present

## 2023-02-03 LAB — VITAMIN D 25 HYDROXY (VIT D DEFICIENCY, FRACTURES): VITD: 29.27 ng/mL — ABNORMAL LOW (ref 30.00–100.00)

## 2023-02-03 LAB — CBC
HCT: 42.7 % (ref 36.0–46.0)
Hemoglobin: 14.6 g/dL (ref 12.0–15.0)
MCHC: 34.2 g/dL (ref 30.0–36.0)
MCV: 94.4 fL (ref 78.0–100.0)
Platelets: 340 10*3/uL (ref 150.0–400.0)
RBC: 4.52 Mil/uL (ref 3.87–5.11)
RDW: 14.2 % (ref 11.5–15.5)
WBC: 6.4 10*3/uL (ref 4.0–10.5)

## 2023-02-03 LAB — COMPREHENSIVE METABOLIC PANEL
ALT: 22 U/L (ref 0–35)
AST: 24 U/L (ref 0–37)
Albumin: 4.4 g/dL (ref 3.5–5.2)
Alkaline Phosphatase: 47 U/L (ref 39–117)
BUN: 9 mg/dL (ref 6–23)
CO2: 26 meq/L (ref 19–32)
Calcium: 9.9 mg/dL (ref 8.4–10.5)
Chloride: 101 meq/L (ref 96–112)
Creatinine, Ser: 0.44 mg/dL (ref 0.40–1.20)
GFR: 98.38 mL/min (ref >60.00)
Glucose, Bld: 76 mg/dL (ref 70–99)
Potassium: 4.8 meq/L (ref 3.5–5.1)
Sodium: 138 meq/L (ref 135–145)
Total Bilirubin: 0.5 mg/dL (ref 0.2–1.2)
Total Protein: 7.6 g/dL (ref 6.0–8.3)

## 2023-02-03 LAB — LIPID PANEL
Cholesterol: 307 mg/dL — ABNORMAL HIGH (ref 0–200)
HDL: 92.5 mg/dL (ref >39.00)
LDL Cholesterol: 196 mg/dL — ABNORMAL HIGH (ref 0–99)
NonHDL: 214.59
Total CHOL/HDL Ratio: 3
Triglycerides: 94 mg/dL (ref 0.0–149.0)
VLDL: 18.8 mg/dL (ref 0.0–40.0)

## 2023-02-03 LAB — TSH: TSH: 0.79 u[IU]/mL (ref 0.35–5.50)

## 2023-02-03 LAB — HEMOGLOBIN A1C: Hgb A1c MFr Bld: 5.9 % (ref 4.6–6.5)

## 2023-02-03 NOTE — Assessment & Plan Note (Signed)
Mood stable on Wellbutrin 150 mg daily.  PHQ score 0.

## 2023-02-03 NOTE — Patient Instructions (Signed)
It was very nice to see you today!  I think you have a ganglion cyst.  I will refer you to see the hand surgeon.  We will check blood work today.  Please continue to work on diet and exercise.  I will see back in year for your next physical.  Come back sooner if needed.  Return in about 1 year (around 02/03/2024) for Annual Physical.   Take care, Dr [name]  PLEASE NOTE:  If you had any lab tests, please let us know if you have not heard back within a few days. You may see your results on mychart before we have a chance to review them but we will give you a call once they are reviewed by [place].   If we ordered any referrals today, please let us know if you have not heard from their office within the next week.   If you had any urgent prescriptions sent in today, please check with the pharmacy within an hour of our visit to make sure the prescription was transmitted appropriately.   Please try these tips to maintain a healthy lifestyle:  Eat at least 3 REAL meals and 1-2 snacks per day.  Aim for no more than 5 hours between eating.  If you eat breakfast, please do so within one hour of getting up.   Each meal should contain half fruits/vegetables, one quarter protein, and one quarter carbs (no bigger than a computer mouse)  Cut down on sweet beverages. This includes juice, soda, and sweet tea.   Drink at least 1 glass of water with each meal and aim for at least 8 glasses per day  Exercise at least 150 minutes every week.    Preventive Care 51 Years and Older, Female Preventive care refers to lifestyle choices and visits with your health care provider that can promote health and wellness. Preventive care visits are also called wellness exams. What can I expect for my preventive care visit? Counseling Your health care provider may ask you questions about your: Medical history, including: Past medical problems. Family medical history. Pregnancy and menstrual history. History of  falls. Current health, including: Memory and ability to understand (cognition). Emotional well-being. Home life and relationship well-being. Sexual activity and sexual health. Lifestyle, including: Alcohol, nicotine or tobacco, and drug use. Access to firearms. Diet, exercise, and sleep habits. Work and work Astronomer. Sunscreen use. Safety issues such as seatbelt and bike helmet use. Physical exam Your health care provider will check your: Height and weight. These may be used to calculate your BMI (body mass index). BMI is a measurement that tells if you are at a healthy weight. Waist circumference. This measures the distance around your waistline. This measurement also tells if you are at a healthy weight and may help predict your risk of certain diseases, such as type 2 diabetes and high blood pressure. Heart rate and blood pressure. Body temperature. Skin for abnormal spots. What immunizations do I need?  Vaccines are usually given at various ages, according to a schedule. Your health care provider will recommend vaccines for you based on your age, medical history, and lifestyle or other factors, such as travel or where you work. What tests do I need? Screening Your health care provider may recommend screening tests for certain conditions. This may include: Lipid and cholesterol levels. Hepatitis C test. Hepatitis B test. HIV (human immunodeficiency virus) test. STI (sexually transmitted infection) testing, if you are at risk. Lung cancer screening. Colorectal cancer screening. Diabetes  screening. This is done by checking your blood sugar (glucose) after you have not eaten for a while (fasting). Mammogram. Talk with your health care provider about how often you should have regular mammograms. BRCA-related cancer screening. This may be done if you have a family history of breast, ovarian, tubal, or peritoneal cancers. Bone density scan. This is done to screen for  osteoporosis. Talk with your health care provider about your test results, treatment options, and if necessary, the need for more tests. Follow these instructions at home: Eating and drinking  Eat a diet that includes fresh fruits and vegetables, whole grains, lean protein, and low-fat dairy products. Limit your intake of foods with high amounts of sugar, saturated fats, and salt. Take vitamin and mineral supplements as recommended by your health care provider. Do not drink alcohol if your health care provider tells you not to drink. If you drink alcohol: Limit how much you have to 0-1 drink a day. Know how much alcohol is in your drink. In the [place] one drink equals one 12 oz bottle of beer (355 mL), one 5 oz glass of wine (148 mL), or one 1 oz glass of hard liquor (44 mL). Lifestyle Brush your teeth every morning and night with fluoride toothpaste. Floss one time each day. Exercise for at least 30 minutes 5 or more days each week. Do not use any products that contain nicotine or tobacco. These products include cigarettes, chewing tobacco, and vaping devices, such as e-cigarettes. If you need help quitting, ask your health care provider. Do not use drugs. If you are sexually active, practice safe sex. Use a condom or other form of protection in order to prevent STIs. Take aspirin only as told by your health care provider. Make sure that you understand how much to take and what form to take. Work with your health care provider to find out whether it is safe and beneficial for you to take aspirin daily. Ask your health care provider if you need to take a cholesterol-lowering medicine (statin). Find healthy ways to manage stress, such as: Meditation, yoga, or listening to music. Journaling. Talking to a trusted person. Spending time with friends and family. Minimize exposure to UV radiation to reduce your risk of skin cancer. Safety Always wear your seat belt while driving or riding in a  vehicle. Do not drive: If you have been drinking alcohol. Do not ride with someone who has been drinking. When you are tired or distracted. While texting. If you have been using any mind-altering substances or drugs. Wear a helmet and other protective equipment during sports activities. If you have firearms in your house, make sure you follow all gun safety procedures. What's next? Visit your health care provider once a year for an annual wellness visit. Ask your health care provider how often you should have your eyes and teeth checked. Stay up to date on all vaccines. This information is not intended to replace advice given to you by your health care provider. Make sure you discuss any questions you have with your health care provider. Document Revised: 08/13/2020 Document Reviewed: 08/13/2020 Elsevier Patient Education  2024 [employer].

## 2023-02-03 NOTE — Assessment & Plan Note (Signed)
Check lipids. Discussed lifestyle modifications.  

## 2023-02-03 NOTE — Assessment & Plan Note (Signed)
Check vitamin D. 

## 2023-02-03 NOTE — Progress Notes (Signed)
Chief Complaint:  Diana Case is a 67 y.o. female who presents today for her annual comprehensive physical exam.    Assessment/Plan:  New/Acute Problems: Ganglion Cyst No red flags.  Exam and history consistent with ganglion cyst.  We discussed aspiration and injection versus referral to see a surgeon.  Will place referral to see hand surgery today.  Chronic Problems Addressed Today: Essential hypertension Blood pressure at goal today off meds.  Depression, major, single episode, complete remission (HCC) Mood stable on Wellbutrin 150 mg daily.  PHQ score 0.  Dyslipidemia Check lipids.  Discussed lifestyle modifications.  Avitaminosis D Check vitamin D.  Preventative Healthcare: Flu shot given today.  Check labs.  Order Cologuard.  Due for mammogram next month.  Patient Counseling(The following topics were reviewed and/or handout was given):  -Nutrition: Stressed importance of moderation in sodium/caffeine intake, saturated fat and cholesterol, caloric balance, sufficient intake of fresh fruits, vegetables, and fiber.  -Stressed the importance of regular exercise.   -Substance Abuse: Discussed cessation/primary prevention of tobacco, alcohol, or other drug use; driving or other dangerous activities under the influence; availability of treatment for abuse.   -Injury prevention: Discussed safety belts, safety helmets, smoke detector, smoking near bedding or upholstery.   -Sexuality: Discussed sexually transmitted diseases, partner selection, use of condoms, avoidance of unintended pregnancy and contraceptive alternatives.   -Dental health: Discussed importance of regular tooth brushing, flossing, and dental visits.  -Health maintenance and immunizations reviewed. Please refer to Health maintenance section.  Return to care in 1 year for next preventative visit.     Subjective:  HPI:  She has no acute complaints today.   She does have a nodule on her right hand near  the base of her fifth digit. This has been going on for years. She does have a history  Lifestyle Diet: Trying intermittent fasting which does seem to be helpful.  Exercise: Does a lot of walking.      02/03/2023    8:27 AM  Depression screen PHQ 2/9  Decreased Interest 0  Down, Depressed, Hopeless 0  PHQ - 2 Score 0  Altered sleeping 0  Tired, decreased energy 0  Change in appetite 0  Feeling bad or failure about yourself  0  Trouble concentrating 0  Moving slowly or fidgety/restless 0  Suicidal thoughts 0  PHQ-9 Score 0  Difficult doing work/chores Not difficult at all    Health Maintenance Due  Topic Date Due   DTaP/Tdap/Td  Never done   Fecal DNA (Cologuard)  Never done     ROS: Per HPI, otherwise a complete review of systems was negative.   PMH:  The following were reviewed and entered/updated in epic: Past Medical History:  Diagnosis Date   Dysrhythmia    palpitations   Hypertension    MVP (mitral valve prolapse)    Patient Active Problem List   Diagnosis Date Noted   Avitaminosis D 01/28/2022   Dyslipidemia 12/27/2017   Chronic pain of left knee 05/20/2017   Essential hypertension 02/17/2017   Depression, major, single episode, complete remission (HCC) 02/17/2017   Retinal detachment, left 01/06/2012   Retinal tear 01/06/2012   Status post intraocular lens implant 01/06/2012   Recurrent HSV (herpes simplex virus) 08/27/2011   Past Surgical History:  Procedure Laterality Date   CESAREAN SECTION     DIAGNOSTIC LAPAROSCOPY     LAPAROSCOPIC ASSISTED VAGINAL HYSTERECTOMY N/A 12/26/2013   Procedure: LAPAROSCOPIC ASSISTED VAGINAL HYSTERECTOMY;  Surgeon: Sharon Seller, DO;  Location: [facility];  Service: Gynecology;  Laterality: N/A;    History reviewed. No pertinent family history.  Medications- reviewed and updated Current Outpatient Medications  Medication Sig Dispense Refill   acetaminophen (TYLENOL) 500 MG tablet Take 1,000 mg by mouth every 6  (six) hours as needed.     Biotin 5000 MCG SUBL Place under the tongue.     buPROPion (WELLBUTRIN XL) 150 MG 24 hr tablet TAKE 1 TABLET(150 MG) BY MOUTH DAILY 90 tablet 0   ibuprofen (ADVIL,MOTRIN) 200 MG tablet Take 400 mg by mouth as needed.     Melatonin 10 MG TABS Take by mouth.     Vitamin D, Ergocalciferol, (DRISDOL) 1.25 MG (50000 UNIT) CAPS capsule TAKE 1 CAPSULE BY MOUTH EVERY 7 DAYS 12 capsule 0   No current facility-administered medications for this visit.    Allergies-reviewed and updated Allergies  Allergen Reactions   Adhesive [Tape] Rash    Social History   Socioeconomic History   Marital status: Married    Spouse name: Not on file   Number of children: Not on file   Years of education: Not on file   Highest education level: Not on file  Occupational History   Not on file  Tobacco Use   Smoking status: Never   Smokeless tobacco: Never  Substance and Sexual Activity   Alcohol use: Yes    Comment: daily   Drug use: No   Sexual activity: Not on file  Other Topics Concern   Not on file  Social History Narrative   Not on file   Social Determinants of Health   Financial Resource Strain: Low Risk  (06/28/2022)   Overall Financial Resource Strain (CARDIA)    Difficulty of Paying Living Expenses: Not hard at all  Food Insecurity: No Food Insecurity (06/28/2022)   Hunger Vital Sign    Worried About Running Out of Food in the Last Year: Never true    Ran Out of Food in the Last Year: Never true  Transportation Needs: No Transportation Needs (06/28/2022)   PRAPARE - Administrator, Civil Service (Medical): No    Lack of Transportation (Non-Medical): No  Physical Activity: Sufficiently Active (06/28/2022)   Exercise Vital Sign    Days of Exercise per Week: 5 days    Minutes of Exercise per Session: 50 min  Stress: No Stress Concern Present (06/28/2022)   [employer] of Occupational Health - Occupational Stress Questionnaire    Feeling of Stress  : Only a little  Social Connections: Moderately Integrated (06/28/2022)   Social Connection and Isolation Panel [NHANES]    Frequency of Communication with Friends and Family: More than three times a week    Frequency of Social Gatherings with Friends and Family: More than three times a week    Attends Religious Services: Never    Database administrator or Organizations: Yes    Attends Engineer, structural: 1 to 4 times per year    Marital Status: Married        Objective:  Physical Exam: BP 116/81   Pulse 82   Temp (!) 97.5 F (36.4 C) (Temporal)   Ht 5' 2.5" (1.588 m)   Wt 135 lb 9.6 oz (61.5 kg)   SpO2 96%   BMI 24.41 kg/m   Body mass index is 24.41 kg/m. Wt Readings from Last 3 Encounters:  02/03/23 135 lb 9.6 oz (61.5 kg)  06/28/22 141 lb (64 kg)  01/28/22 141 lb 9.6  oz (64.2 kg)   Gen: NAD, resting comfortably HEENT: TMs normal bilaterally. OP clear. No thyromegaly noted.  CV: RRR with no murmurs appreciated Pulm: NWOB, CTAB with no crackles, wheezes, or rhonchi GI: Normal bowel sounds present. Soft, Nontender, Nondistended. MSK: no edema, cyanosis, or clubbing noted - Left Hand: Freely mobile 1 cm nodule on ulnar aspect of proximal fifth digit. Skin: warm, dry Neuro: CN2-12 grossly intact. Strength 5/5 in upper and lower extremities. Reflexes symmetric and intact bilaterally.  Psych: Normal affect and thought content     Cregg Jutte M. [name], MD 02/03/2023 9:10 AM

## 2023-02-03 NOTE — Assessment & Plan Note (Signed)
Blood pressure at goal today off meds. ?

## 2023-02-04 NOTE — Progress Notes (Signed)
Her cholesterol is higher than last year.  We probably should start a cholesterol medication to improve her numbers and lower risk of heart attack and stroke.  Recommend starting Lipitor 20 mg daily.  Please send in a prescription if she is agreeable.  We can recheck again in 6 to 12 months.  She should continue to work on diet and exercise.  Vitamin D is better than last year though still borderline low.  Recommend she take 2000 IUs daily and we can recheck this again in 6 to 12 months.  Her A1c is borderline but stable compared to the past previous values.  Do not need to start meds for this however she should continue to work on diet and exercise and we can recheck in a year or so.  The rest of her labs are all normal and we can recheck everything else in a year.

## 2023-02-10 ENCOUNTER — Other Ambulatory Visit: Payer: Self-pay | Admitting: *Deleted

## 2023-02-10 MED ORDER — ATORVASTATIN CALCIUM 20 MG PO TABS: 20.0000 mg | ORAL_TABLET | Freq: Every day | ORAL | 3 refills | Status: DC

## 2023-03-09 ENCOUNTER — Other Ambulatory Visit (INDEPENDENT_AMBULATORY_CARE_PROVIDER_SITE_OTHER): Payer: PPO

## 2023-03-09 ENCOUNTER — Ambulatory Visit: Payer: PPO | Admitting: Orthopedic Surgery

## 2023-03-09 DIAGNOSIS — M67441 Ganglion, right hand: Secondary | ICD-10-CM

## 2023-03-09 NOTE — Progress Notes (Signed)
 Diana Case - 68 y.o. female MRN [id number]  Date of birth: Oct 24, 1955  Office Visit Note: Visit Date: 03/09/2023 PCP: Kennyth Worth HERO, MD Referred by: Kennyth Worth HERO, MD  Subjective: No chief complaint on file.  HPI: Diana Case is a pleasant 68 y.o. female who presents today for evaluation of a right small finger mass that was present for multiple years, becoming more symptomatic in nature from a pain and stiffness standpoint.  She does not feel that it has become much larger, and is located on the palmar ulnar aspect of the small finger at the P1 level.  No previous interventions have been performed.  Pertinent ROS were reviewed with the patient and found to be negative unless otherwise specified above in HPI.   Visit Reason: right small finger mass Duration of symptoms: years Hand dominance: right Occupation: retired Diabetic: No Smoking: No Heart/Lung History:none Blood Thinners:  none  Prior Testing/EMG:none Injections (Date):none Treatments:none Prior Surgery: none  Assessment & Plan: Visit Diagnoses:  1. Ganglion cyst of finger of right hand     Plan: Based on her examination today, there is concern for a likely ganglion cyst of the right small finger, possible emanating from the flexor sheath.  Given the larger nature of this and the pattern of pain in this region, associated stiffness, particularly given its location, would like to further evaluate this to look for any significant nerve or tendon involvement with an MRI study.  She is interested in surgical intervention in the form of mass excision, she will return to me after the MRI is complete to review the results and likely pursue surgical intervention.  Risks and benefits of the procedure were discussed, risks including but not limited to infection, bleeding, scarring, stiffness, nerve injury, tendon injury, vascular injury,, recurrence of symptoms and need for subsequent operation.  Patient  expressed understanding.      Follow-up: No follow-ups on file.   Meds & Orders: No orders of the defined types were placed in this encounter.   Orders Placed This Encounter  Procedures   XR Hand Complete Right   MR HAND RIGHT WO CONTRAST     Procedures: No procedures performed      Clinical History: No specialty comments available.  She reports that she has never smoked. She has never used smokeless tobacco.  Recent Labs    02/03/23 0941  HGBA1C 5.9    Objective:   Vital Signs: There were no vitals taken for this visit.  Physical Exam  Gen: Well-appearing, in no acute distress; non-toxic CV: Regular Rate. Well-perfused. Warm.  Resp: Breathing unlabored on room air; no wheezing. Psych: Fluid speech in conversation; appropriate affect; normal thought process  Ortho Exam Right hand: Small finger - Right hand with appropriate digital range of motion, small finger is held in a slightly flexed position at the PIP - Palpable mass notable right small finger over the volar P1 region, extends ulnarly as well, slightly positive Tinel's over the neurovascular bundle ulnarly - Mass measures approximately 1.5 cm x 1.0 cm, soft, compressible, slightly mobile, difficult to tell transillumination  Imaging: XR Hand Complete Right Result Date: 03/09/2023 Right hand x-rays were obtained today, multiple views X-rays demonstrate stable appearance of the midcarpal articulations and small joints of the digits.  Small finger with moderate degenerative change notable at the PIP region with joint space narrowing, soft tissue elevation notable over the ulnar P1 region of the small finger.   Past Medical/Family/Surgical/Social History: Medications &  Allergies reviewed per EMR, new medications updated. Patient Active Problem List   Diagnosis Date Noted   Avitaminosis D 01/28/2022   Dyslipidemia 12/27/2017   Chronic pain of left knee 05/20/2017   Essential hypertension 02/17/2017    Depression, major, single episode, complete remission (HCC) 02/17/2017   Retinal detachment, left 01/06/2012   Retinal tear 01/06/2012   Status post intraocular lens implant 01/06/2012   Recurrent HSV (herpes simplex virus) 08/27/2011   Past Medical History:  Diagnosis Date   Dysrhythmia    palpitations   Hypertension    MVP (mitral valve prolapse)    No family history on file. Past Surgical History:  Procedure Laterality Date   CESAREAN SECTION     DIAGNOSTIC LAPAROSCOPY     LAPAROSCOPIC ASSISTED VAGINAL HYSTERECTOMY N/A 12/26/2013   Procedure: LAPAROSCOPIC ASSISTED VAGINAL HYSTERECTOMY;  Surgeon: Delon CHRISTELLA Prude, DO;  Location: [facility];  Service: Gynecology;  Laterality: N/A;   Social History   Occupational History   Not on file  Tobacco Use   Smoking status: Never   Smokeless tobacco: Never  Substance and Sexual Activity   Alcohol use: Yes    Comment: daily   Drug use: No   Sexual activity: Not on file    Diana Case) Diana Case, M.D. [facility] 6:30 PM

## 2023-03-17 DIAGNOSIS — F4323 Adjustment disorder with mixed anxiety and depressed mood: Secondary | ICD-10-CM | POA: Diagnosis not present

## 2023-03-17 DIAGNOSIS — F321 Major depressive disorder, single episode, moderate: Secondary | ICD-10-CM | POA: Diagnosis not present

## 2023-03-28 ENCOUNTER — Ambulatory Visit
Admission: RE | Admit: 2023-03-28 | Discharge: 2023-03-28 | Disposition: A | Discharge status: Home / Self Care | Payer: PPO | Source: Ambulatory Visit | Attending: Orthopedic Surgery

## 2023-03-28 DIAGNOSIS — R2231 Localized swelling, mass and lump, right upper limb: Secondary | ICD-10-CM | POA: Diagnosis not present

## 2023-03-28 DIAGNOSIS — M67441 Ganglion, right hand: Secondary | ICD-10-CM

## 2023-04-05 ENCOUNTER — Encounter: Payer: Self-pay | Admitting: Family Medicine

## 2023-04-05 DIAGNOSIS — Z1231 Encounter for screening mammogram for malignant neoplasm of breast: Secondary | ICD-10-CM | POA: Diagnosis not present

## 2023-04-05 LAB — HM MAMMOGRAPHY

## 2023-04-06 ENCOUNTER — Ambulatory Visit: Payer: PPO | Admitting: Orthopedic Surgery

## 2023-04-06 DIAGNOSIS — R2231 Localized swelling, mass and lump, right upper limb: Secondary | ICD-10-CM

## 2023-04-06 NOTE — Progress Notes (Signed)
 Diana Case - 68 y.o. female MRN [id number]  Date of birth: 04-18-1955  Office Visit Note: Visit Date: 04/06/2023 PCP: Kennyth Worth HERO, MD Referred by: Kennyth Worth HERO, MD  Subjective: No chief complaint on file.  HPI: Diana Case is a pleasant 68 y.o. female who presents today for follow-up of a right small finger mass that was present for multiple years, becoming more symptomatic in nature from a pain and stiffness standpoint.  She does not feel that it has become much larger, and is located on the palmar ulnar aspect of the small finger at the P1 level.  No previous interventions have been performed.  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  Pertinent ROS were reviewed with the patient and found to be negative unless otherwise specified above in HPI.    Assessment & Plan: Visit Diagnoses:  No diagnosis found.   Plan: Given her ongoing symptoms and workup, she is indicated for right small finger mass excision.  We discussed the MRI findings today which show possible giant cell tumor versus epidermal inclusion cyst.  We did discuss the proximity of the neurovascular structures along the ulnar border of the digit and the potential for persistent numbness in this distribution postoperatively.  Other risk and benefit of the surgery were discussed in detail as well, including but not limited to infection, bleeding, scarring, stiffness, nerve injury, tendon injury, vascular injury,, recurrence of symptoms and need for subsequent operation.  Patient expressed understanding.    We discussed various forms of anesthesia.  She is interested in having surgery performed under local anesthesia.  Will move forward surgical scheduling of right small finger mass excision under local anesthesia.  We discussed that the excised mass will be sent for permanent specimen for confirmation of diagnosis as well.    Follow-up: No follow-ups on  file.   Meds & Orders: No orders of the defined types were placed in this encounter.   No orders of the defined types were placed in this encounter.    Procedures: No procedures performed      Clinical History: No specialty comments available.  She reports that she has never smoked. She has never used smokeless tobacco.  Recent Labs    02/03/23 0941  HGBA1C 5.9    Objective:   Vital Signs: There were no vitals taken for this visit.  Physical Exam  Gen: Well-appearing, in no acute distress; non-toxic CV: Regular Rate. Well-perfused. Warm.  Resp: Breathing unlabored on room air; no wheezing. Psych: Fluid speech in conversation; appropriate affect; normal thought process  Ortho Exam Right hand: Small finger - Right hand with appropriate digital range of motion, small finger is held in a slightly flexed position at the PIP - Palpable mass notable right small finger over the volar P1 region, extends ulnarly as well, slightly positive Tinel's over the neurovascular bundle ulnarly - Mass measures approximately 1.5 cm x 1.0 cm, soft, compressible, slightly mobile - Sensation intact distally along the ulnar border of the digit, capillary refill is appropriate  Imaging: Prior MRI of the right hand was reviewed today, in chart   Past Medical/Family/Surgical/Social History: Medications & Allergies reviewed per EMR, new medications updated. Patient Active Problem List   Diagnosis Date Noted   Avitaminosis D 01/28/2022   Dyslipidemia 12/27/2017   Chronic pain of left knee 05/20/2017   Essential hypertension 02/17/2017   Depression, major, single episode, complete remission (HCC) 02/17/2017   Retinal detachment,  left 01/06/2012   Retinal tear 01/06/2012   Status post intraocular lens implant 01/06/2012   Recurrent HSV (herpes simplex virus) 08/27/2011   Past Medical History:  Diagnosis Date   Dysrhythmia    palpitations   Hypertension    MVP (mitral valve prolapse)    No  family history on file. Past Surgical History:  Procedure Laterality Date   CESAREAN SECTION     DIAGNOSTIC LAPAROSCOPY     LAPAROSCOPIC ASSISTED VAGINAL HYSTERECTOMY N/A 12/26/2013   Procedure: LAPAROSCOPIC ASSISTED VAGINAL HYSTERECTOMY;  Surgeon: Delon CHRISTELLA Prude, DO;  Location: [facility];  Service: Gynecology;  Laterality: N/A;   Social History   Occupational History   Not on file  Tobacco Use   Smoking status: Never   Smokeless tobacco: Never  Substance and Sexual Activity   Alcohol use: Yes    Comment: daily   Drug use: No   Sexual activity: Not on file    Yoshiye Kraft Estela) Arlinda, M.D. [facility] 10:29 AM

## 2023-04-07 ENCOUNTER — Other Ambulatory Visit: Payer: Self-pay | Admitting: Orthopedic Surgery

## 2023-04-07 DIAGNOSIS — D3612 Benign neoplasm of peripheral nerves and autonomic nervous system, upper limb, including shoulder: Secondary | ICD-10-CM | POA: Diagnosis not present

## 2023-04-07 DIAGNOSIS — R2231 Localized swelling, mass and lump, right upper limb: Secondary | ICD-10-CM | POA: Diagnosis not present

## 2023-04-07 MED ORDER — ACETAMINOPHEN-CODEINE 300-30 MG PO TABS: 1.0000 | ORAL_TABLET | Freq: Four times a day (QID) | ORAL | 0 refills | Status: DC | PRN

## 2023-04-11 ENCOUNTER — Other Ambulatory Visit: Payer: Self-pay | Admitting: Family Medicine

## 2023-04-21 ENCOUNTER — Encounter: Payer: PPO | Admitting: Orthopedic Surgery

## 2023-04-25 ENCOUNTER — Ambulatory Visit (INDEPENDENT_AMBULATORY_CARE_PROVIDER_SITE_OTHER): Payer: PPO | Admitting: Orthopedic Surgery

## 2023-04-25 DIAGNOSIS — R2231 Localized swelling, mass and lump, right upper limb: Secondary | ICD-10-CM

## 2023-04-25 NOTE — Addendum Note (Signed)
 Addended by: Ellis Savage on: 04/25/2023 08:53 AM   Modules accepted: Orders

## 2023-04-25 NOTE — Progress Notes (Signed)
   Diana Case - 68 y.o. female MRN [id number]  Date of birth: Sep 22, 1955  Office Visit Note: Visit Date: 04/25/2023 PCP: Ardith Dark, MD Referred by: Ardith Dark, MD  Subjective:  HPI: Diana Case is a 68 y.o. female who presents today for follow up 2 weeks status post right small finger mass excision.  Pathology indicates schwannoma, negative for malignancy.  She does have some residual numbness along the ulnar border of the small finger postoperatively, distal to the incisional site.  Pertinent ROS were reviewed with the patient and found to be negative unless otherwise specified above in HPI.   Assessment & Plan: Visit Diagnoses:  1. Finger mass, right     Plan: Pathology was reviewed in detail with the patient today.  I did explain that the mass in question was coming directly off of the digital nerve region, which is consistent with the schwannoma diagnosis.  We discussed the benign nature of his condition, we discussed potential recurrence, we also discussed the possibility for persistent numbness postoperatively in this distribution.  We will begin occupational therapy for range of motion, scar desensitization and hypersensitivity training.  Follow-up in approximate 6 weeks.  I explained to her the likelihood of neuropraxic reaction in this area given the extent of the mass and digital nerve involvement, we will take a watch and wait approach in terms of recovery from a sensation standpoint currently.  I did explain to her the importance of sensation along the ulnar border of the digit from a protective standpoint.  Follow-up: No follow-ups on file.   Meds & Orders: No orders of the defined types were placed in this encounter.  No orders of the defined types were placed in this encounter.    Procedures: No procedures performed       Objective:   Vital Signs: There were no vitals taken for this visit.  Ortho Exam Right small finger: - Well-healing  incision, sutures removed today, skin edges well-approximated without erythema or drainage - Digital range of motion small finger PIP 0-30, DIP 0-15 - Sensation intact to light touch at the P1 region ulnar border, distal to the incisional site unable to discern 2 point discrimination along the ulnar border, pressure sensation is intact  Imaging: No results found.   Joffrey Kerce Trevor Mace, M.D. [facility], Hand Surgery

## 2023-04-29 NOTE — Therapy (Signed)
 OUTPATIENT OCCUPATIONAL THERAPY ORTHO EVALUATION  Patient Name: Diana Case MRN: [id number] DOB:1956-01-17, 68 y.o., female Today's Date: 05/02/2023  PCP: Lupita Shutter MD REFERRING PROVIDER:  Samuella Cota, MD    END OF SESSION:  OT End of Session - 05/02/23 1019     Visit Number 1    Number of Visits 5    Date for OT Re-Evaluation 06/03/23    Authorization Type [employer]    OT Start Time 1019    OT Stop Time 1056    OT Time Calculation (min) 37 min    Activity Tolerance Patient tolerated treatment well;Patient limited by pain;Patient limited by fatigue    Behavior During Therapy [facility] for tasks assessed/performed             Past Medical History:  Diagnosis Date   Dysrhythmia    palpitations   Hypertension    MVP (mitral valve prolapse)    Past Surgical History:  Procedure Laterality Date   CESAREAN SECTION     DIAGNOSTIC LAPAROSCOPY     LAPAROSCOPIC ASSISTED VAGINAL HYSTERECTOMY N/A 12/26/2013   Procedure: LAPAROSCOPIC ASSISTED VAGINAL HYSTERECTOMY;  Surgeon: Sharon Seller, DO;  Location: [facility];  Service: Gynecology;  Laterality: N/A;   Patient Active Problem List   Diagnosis Date Noted   Avitaminosis D 01/28/2022   Dyslipidemia 12/27/2017   Chronic pain of left knee 05/20/2017   Essential hypertension 02/17/2017   Depression, major, single episode, complete remission (HCC) 02/17/2017   Retinal detachment, left 01/06/2012   Retinal tear 01/06/2012   Status post intraocular lens implant 01/06/2012   Recurrent HSV (herpes simplex virus) 08/27/2011    ONSET DATE: DOS 04/07/23  REFERRING DIAG: R22.31 (ICD-10-CM) - Finger mass, right   THERAPY DIAG:  Localized edema  Other lack of coordination  Stiffness of right hand, not elsewhere classified  Pain in right hand  Rationale for Evaluation and Treatment: Rehabilitation  SUBJECTIVE:   SUBJECTIVE STATEMENT: She states having a mass in her Rt hand SF and her finger was contracting for  2-3 years, and finally had sx removal.  She has an apparent flexed position and some complaints of pain, hypersensitivity and weakness.  She enjoys making pottery and is retired.    PERTINENT HISTORY: 3+ weeks status post right small finger mass excision.  Pathology indicates schwannoma, negative for malignancy.  She does have some residual numbness along the ulnar border of the small finger postoperatively, distal to the incisional site.   PRECAUTIONS: None  RED FLAGS: None   WEIGHT BEARING RESTRICTIONS: No  PAIN:  Are you having pain? Yes: NPRS scale: 1/10 pain at rest now, and up to 5/10 in past week at worst Pain location: Right small finger Pain description: Burning and hypersensitive Aggravating factors: Striking it Relieving factors: Rest  FALLS: Has patient fallen in last 6 months? No  LIVING ENVIRONMENT: Lives with: lives with their family Lives in: House/apartment Has following equipment at home: None  PLOF: Independent  PATIENT GOALS: To improve use of right dominant hand  NEXT MD VISIT: As needed   OBJECTIVE: (All objective assessments below are from initial evaluation on: 05/02/23 unless otherwise specified.)   HAND DOMINANCE: Right   ADLs: Overall ADLs: States decreased ability to grab, hold household objects, pain and difficulty to open containers, perform FMS tasks (manipulate fasteners on clothing)   FUNCTIONAL OUTCOME MEASURES: Eval: Patient Specific Functional Scale: 3.8 (writing, pottery wheel, cooking)  (Higher Score  =  Better Ability for the Selected Tasks)  UPPER EXTREMITY ROM     Shoulder to Wrist AROM Right eval Left eval  Wrist flexion 69 62  Wrist extension 65 69  (Blank rows = not tested)   Hand AROM Right eval  Full Fist Ability (or Gap to Distal Palmar Crease) Unable with small finger of right hand  Thumb Opposition  (Kapandji Scale)  Within functional limits  Little MCP (0-90) 0 - 78  Little PIP (0-100) (-12) - 91   Little DIP (0-70) 0-  69  (Blank rows = not tested)   UPPER EXTREMITY MMT:    Eval:  NT at eval due to recent and still healing injuries, but largely 3 -/5 in the right hand small finger today. Will be tested when appropriate.   MMT Right TBD  Shoulder flexion   Shoulder abduction   Shoulder adduction   Shoulder extension   Shoulder internal rotation   Shoulder external rotation   Middle trapezius   Lower trapezius   Elbow flexion   Elbow extension   Forearm supination   Forearm pronation   Wrist flexion   Wrist extension   Wrist ulnar deviation   Wrist radial deviation   (Blank rows = not tested)  HAND FUNCTION: Eval: Observed weakness in affected Rt hand.  Grip strength Right: 28 lbs, Left: 38 lbs   COORDINATION: Eval: Observed coordination impairments with affected right hand, as seen by inability to make a full composite fist.  Details will be tested when appropriate/as needed.  SENSATION: Eval: She has light touch ability though hypersensitive around sx area, also some areas that feel numb or tingly (mixed paresthesia)  EDEMA:   Eval:  Mildly swollen in right hand small finger today  COGNITION: Eval: Overall cognitive status: WFL for evaluation today   OBSERVATIONS:   Eval: Somewhat passively correctable flexion contracture at small finger PIP joint, better ability to bend in composite fist   TODAY'S TREATMENT:  Post-evaluation treatment:   She was given initial and copious education on systematic, progressive desensitization techniques in the small finger surgical area.  She was also given the following home exercise program to perform 4-6 times a day as tolerated, intermittently.  These exercises should not be aggressive for painful but she should feel active stretch points.  Exercises - Ulnar Nerve Flossing  - 3-4 x daily - 1-2 sets - 5-10 reps - Wrist Flexion Stretch  - 4 x daily - 2-3 reps - 15 sec hold - Wrist Prayer Stretch  - 4 x daily - 3-5 reps  - 15 sec hold - Tendon Glides  - 4-6 x daily - 5 reps - 2-3 seconds hold - Hand AROM Reverse Blocking  - 4-6 x daily - 10-15 reps - 2-3 sec hold - PUSH KNUCKLES DOWN  - 4 x daily - 3-5 reps - 15 seconds hold Patient Education - Rubbing with Different Textures    PATIENT EDUCATION: Education details: See tx section above for details  Person educated: Patient Education method: Verbal Instruction, Teach back, Handouts  Education comprehension: States and demonstrates understanding, Additional Education required    HOME EXERCISE PROGRAM: Access Code: [email or web address] URL: [email or web address] Date: 05/02/2023 Prepared by: Fannie Knee   GOALS: Goals reviewed with patient? Yes   SHORT TERM GOALS: (STG required if POC>30 days) Target Date: 05/13/2023  Pt will obtain custom mobilization orthosis. Goal status: TBD/PRN  2.  Pt will demo/state understanding of initial HEP to improve pain levels and prerequisite motion. Goal status: INITIAL   LONG TERM GOALS: Target  Date: 06/03/23  Pt will improve functional ability by decreased impairment per PSFS assessment from 3.8 to 7 or better, for better quality of life. Goal status: INITIAL  2.  Pt will improve grip strength in right hand from 28 lbs to at least 35 lbs for functional use at home and in IADLs. Goal status: INITIAL  3.  Pt will improve A/ROM in right hand small finger total active motion from 226 degrees to at least 235 degrees, to have functional motion for tasks like reach and grasp.  Goal status: INITIAL  4.  Pt will improve strength in right hand and wrist flexion/extension from apparent 3 -/5 MMT to at least 4+/5 MMT to have increased functional ability to carry out selfcare and higher-level homecare tasks with less difficulty. Goal status: INITIAL  5.  Pt will improve coordination skills in right dominant hand, as seen by within functional limit score on box and blocks testing to have increased functional  ability to carry out more complex, coordinated IADLs (meal prep, sports, etc.).  Goal status: INITIAL  6.  Pt will decrease pain at worst from 5/10 to 2/10 or better to have better sleep and occupational participation in daily roles. Goal status: INITIAL   ASSESSMENT:  CLINICAL IMPRESSION: Patient is a 68 y.o. female who was seen today for occupational therapy evaluation for hypersensitive, stiff, weak right hand small finger after surgical removal of neuroma.  She will benefit from outpatient occupational therapy to decrease symptoms, increase prerequisite skills to increase occupational performance.   PERFORMANCE DEFICITS: in functional skills including ADLs, IADLs, coordination, dexterity, proprioception, sensation, edema, ROM, strength, pain, fascial restrictions, muscle spasms, flexibility, Fine motor control, body mechanics, endurance, decreased knowledge of precautions, wound, and UE functional use, cognitive skills including problem solving and safety awareness, and psychosocial skills including coping strategies, environmental adaptation, and habits.   IMPAIRMENTS: are limiting patient from ADLs, IADLs, leisure, and social participation.   COMORBIDITIES: may have co-morbidities  that affects occupational performance. Patient will benefit from skilled OT to address above impairments and improve overall function.  MODIFICATION OR ASSISTANCE TO COMPLETE EVALUATION: No modification of tasks or assist necessary to complete an evaluation.  OT OCCUPATIONAL PROFILE AND HISTORY: Problem focused assessment: Including review of records relating to presenting problem.  CLINICAL DECISION MAKING: LOW - limited treatment options, no task modification necessary  REHAB POTENTIAL: Excellent  EVALUATION COMPLEXITY: Low      PLAN:  OT FREQUENCY: 1x/week  OT DURATION: 4 weeks through 06/03/2023 and up to 5 total visits as needed  PLANNED INTERVENTIONS: 97168 OT Re-evaluation, 97535 self  care/ADL training, 52841 therapeutic exercise, 97530 therapeutic activity, 97112 neuromuscular re-education, 97140 manual therapy, 97035 ultrasound, 97039 fluidotherapy, 97010 moist heat, 97010 cryotherapy, 97130 Cognitive training(each additional 15 min), 32440 Orthotics management and training, 10272 Splinting (initial encounter), coping strategies training, patient/family education, and DME and/or AE instructions  RECOMMENDED OTHER SERVICES: None now  CONSULTED AND AGREED WITH PLAN OF CARE: Patient  PLAN FOR NEXT SESSION:   Consider ext orthosis in next session if AROM not significantly improved    Tempestt Silba, OTR/L, CHT 05/02/2023, 5:42 PM

## 2023-05-02 ENCOUNTER — Ambulatory Visit: Payer: PPO | Admitting: Rehabilitative and Restorative Service Providers"

## 2023-05-02 ENCOUNTER — Encounter: Payer: Self-pay | Admitting: Rehabilitative and Restorative Service Providers"

## 2023-05-02 DIAGNOSIS — R600 Localized edema: Secondary | ICD-10-CM | POA: Diagnosis not present

## 2023-05-02 DIAGNOSIS — R202 Paresthesia of skin: Secondary | ICD-10-CM

## 2023-05-02 DIAGNOSIS — M25641 Stiffness of right hand, not elsewhere classified: Secondary | ICD-10-CM

## 2023-05-02 DIAGNOSIS — M79641 Pain in right hand: Secondary | ICD-10-CM | POA: Diagnosis not present

## 2023-05-02 DIAGNOSIS — R278 Other lack of coordination: Secondary | ICD-10-CM | POA: Diagnosis not present

## 2023-05-10 NOTE — Therapy (Signed)
 OUTPATIENT OCCUPATIONAL THERAPY TREATMENT NOTE   Patient Name: Diana Case MRN: [id number] DOB:07-Jun-1955, 68 y.o., female Today's Date: 05/11/2023  PCP: Lupita Shutter MD REFERRING PROVIDER:  Samuella Cota, MD    END OF SESSION:  OT End of Session - 05/11/23 0865     Visit Number 2    Number of Visits 5    Date for OT Re-Evaluation 06/03/23    Authorization Type [employer]    OT Start Time [phone]    OT Stop Time 1017    OT Time Calculation (min) 38 min    Activity Tolerance Patient tolerated treatment well;Patient limited by pain;Patient limited by fatigue    Behavior During Therapy [facility] for tasks assessed/performed             Past Medical History:  Diagnosis Date   Dysrhythmia    palpitations   Hypertension    MVP (mitral valve prolapse)    Past Surgical History:  Procedure Laterality Date   CESAREAN SECTION     DIAGNOSTIC LAPAROSCOPY     LAPAROSCOPIC ASSISTED VAGINAL HYSTERECTOMY N/A 12/26/2013   Procedure: LAPAROSCOPIC ASSISTED VAGINAL HYSTERECTOMY;  Surgeon: Sharon Seller, DO;  Location: [facility];  Service: Gynecology;  Laterality: N/A;   Patient Active Problem List   Diagnosis Date Noted   Avitaminosis D 01/28/2022   Dyslipidemia 12/27/2017   Chronic pain of left knee 05/20/2017   Essential hypertension 02/17/2017   Depression, major, single episode, complete remission (HCC) 02/17/2017   Retinal detachment, left 01/06/2012   Retinal tear 01/06/2012   Status post intraocular lens implant 01/06/2012   Recurrent HSV (herpes simplex virus) 08/27/2011    ONSET DATE: DOS 04/07/23  REFERRING DIAG: R22.31 (ICD-10-CM) - Finger mass, right   THERAPY DIAG:  Localized edema  Other lack of coordination  Stiffness of right hand, not elsewhere classified  Pain in right hand  Paresthesia of skin  Rationale for Evaluation and Treatment: Rehabilitation  PERTINENT HISTORY: 3+ weeks status post right small finger mass excision.  Pathology indicates  schwannoma, negative for malignancy.  She does have some residual numbness along the ulnar border of the small finger postoperatively, distal to the incisional site.  She states having a mass in her Rt hand SF and her finger was contracting for 2-3 years, and finally had sx removal.  She has an apparent flexed position and some complaints of pain, hypersensitivity and weakness.  She enjoys making pottery and is retired.   PRECAUTIONS: None  RED FLAGS: None   WEIGHT BEARING RESTRICTIONS: No   SUBJECTIVE:   SUBJECTIVE STATEMENT: She states less hypersensitive now.     PAIN:  Are you having pain? Yes: NPRS scale:  0/10 pain at rest now, and up to 5/10 in past week at worst Pain location: Right small finger Pain description: Burning and hypersensitive Aggravating factors: Striking it Relieving factors: Rest   PATIENT GOALS: To improve use of right dominant hand  NEXT MD VISIT: As needed   OBJECTIVE: (All objective assessments below are from initial evaluation on: 05/02/23 unless otherwise specified.)   HAND DOMINANCE: Right   ADLs: Overall ADLs: States decreased ability to grab, hold household objects, pain and difficulty to open containers, perform FMS tasks (manipulate fasteners on clothing)   FUNCTIONAL OUTCOME MEASURES: Eval: Patient Specific Functional Scale: 3.8 (writing, pottery wheel, cooking)  (Higher Score  =  Better Ability for the Selected Tasks)       UPPER EXTREMITY ROM     Shoulder to Wrist AROM  Right eval Left eval  Wrist flexion 69 62  Wrist extension 65 69  (Blank rows = not tested)   Hand AROM Right eval Rt 05/11/23  Full Fist Ability (or Gap to Distal Palmar Crease) Unable with small finger of right hand   Thumb Opposition  (Kapandji Scale)  Within functional limits   Little MCP (0-90) 0 - 78 0 - 90  Little PIP (0-100) (-12) - 91 (-7) - 87  Little DIP (0-70) 0-  69 0 - 79  (Blank rows = not tested)   UPPER EXTREMITY MMT:    Eval:  NT at  eval due to recent and still healing injuries, but largely 3 -/5 in the right hand small finger today. Will be tested when appropriate.   MMT Right TBD  Shoulder flexion   Shoulder abduction   Shoulder adduction   Shoulder extension   Shoulder internal rotation   Shoulder external rotation   Middle trapezius   Lower trapezius   Elbow flexion   Elbow extension   Forearm supination   Forearm pronation   Wrist flexion   Wrist extension   Wrist ulnar deviation   Wrist radial deviation   (Blank rows = not tested)  HAND FUNCTION: Eval: Observed weakness in affected Rt hand.  Grip strength Right: 28 lbs, Left: 38 lbs   COORDINATION: Eval: Observed coordination impairments with affected right hand, as seen by inability to make a full composite fist.  Details will be tested when appropriate/as needed.  SENSATION: Eval: She has light touch ability though hypersensitive around sx area, also some areas that feel numb or tingly (mixed paresthesia)  EDEMA:   Eval:  Mildly swollen in right hand small finger today  OBSERVATIONS:   Eval: Somewhat passively correctable flexion contracture at small finger PIP joint, better ability to bend in composite fist  Hypersensitive and stiff Rt SF  TODAY'S TREATMENT:  05/11/23: She starts with active range of motion for exercise as well as new measures which does show improved performance now.  Her hypersensitivity is also greatly improved and she describes doing it correctly in her home exercises.  OT introduces her to a vibration today which can also help with nerve pain and a parent feedback to the brain.  She uses this manual therapy vibration for 3 to 5 minutes stating feeling great relief.  We discussed that she can use a soft personal brush to achieve the same effect at home.  We also work on nerve gliding for neuromuscular rehab/desensitization.  We reviewed the rest of her home exercise program which does not need much modification, but due to  her remaining PIP joint extension lag OT does fabricate a small custom orthosis to extend the small finger PIP joint to be worn at night only on a light stretch.  She could wear it in the day for 15 to 30 minutes at a time on a medium stretch if she does not tolerate in the night.  She states understanding all directions and leaves in no pain.  Additionally as she is leaving to go on vacation she was given safety/self-care information to avoid gripping and pulling heavy luggage, consider being mindful and while on vacation.   "Wear small brace in night on light stretch to help straighten finger.     (Pad goes up by nail) Can also wear in day for 15-30 mins on bigger stretch, but I prefer/suggest night time.   Be mindful on vacation.   Try brushing or vibration."  Exercises - Ulnar Nerve Flossing  - 3-4 x daily - 1-2 sets - 5-10 reps - Wrist Flexion Stretch  - 4 x daily - 2-3 reps - 15 sec hold - Wrist Prayer Stretch  - 4 x daily - 3-5 reps - 15 sec hold - Tendon Glides  - 4-6 x daily - 5 reps - 2-3 seconds hold - Hand AROM Reverse Blocking  - 4-6 x daily - 10-15 reps - 2-3 sec hold - PUSH KNUCKLES DOWN  - 4 x daily - 3-5 reps - 15 seconds hold Patient Education - Rubbing with Different Textures    PATIENT EDUCATION: Education details: See tx section above for details  Person educated: Patient Education method: Verbal Instruction, Teach back, Handouts  Education comprehension: States and demonstrates understanding, Additional Education required    HOME EXERCISE PROGRAM: Access Code: [email or web address] URL: [email or web address] Date: 05/02/2023 Prepared by: Fannie Knee   GOALS: Goals reviewed with patient? Yes   SHORT TERM GOALS: (STG required if POC>30 days) Target Date: 05/13/2023  Pt will obtain custom mobilization orthosis. Goal status: TBD/PRN  2.  Pt will demo/state understanding of initial HEP to improve pain levels and prerequisite motion. Goal status:  INITIAL   LONG TERM GOALS: Target Date: 06/03/23  Pt will improve functional ability by decreased impairment per PSFS assessment from 3.8 to 7 or better, for better quality of life. Goal status: INITIAL  2.  Pt will improve grip strength in right hand from 28 lbs to at least 35 lbs for functional use at home and in IADLs. Goal status: INITIAL  3.  Pt will improve A/ROM in right hand small finger total active motion from 226 degrees to at least 235 degrees, to have functional motion for tasks like reach and grasp.  Goal status: INITIAL  4.  Pt will improve strength in right hand and wrist flexion/extension from apparent 3 -/5 MMT to at least 4+/5 MMT to have increased functional ability to carry out selfcare and higher-level homecare tasks with less difficulty. Goal status: INITIAL  5.  Pt will improve coordination skills in right dominant hand, as seen by within functional limit score on box and blocks testing to have increased functional ability to carry out more complex, coordinated IADLs (meal prep, sports, etc.).  Goal status: INITIAL  6.  Pt will decrease pain at worst from 5/10 to 2/10 or better to have better sleep and occupational participation in daily roles. Goal status: INITIAL   ASSESSMENT:  CLINICAL IMPRESSION: 05/11/23: Making great improvements and likely will be able to discharge in the next 1-2 visits  Eval: Patient is a 68 y.o. female who was seen today for occupational therapy evaluation for hypersensitive, stiff, weak right hand small finger after surgical removal of neuroma.  She will benefit from outpatient occupational therapy to decrease symptoms, increase prerequisite skills to increase occupational performance.     PLAN:  OT FREQUENCY: 1x/week  OT DURATION: 4 weeks through 06/03/2023 and up to 5 total visits as needed  PLANNED INTERVENTIONS: 97168 OT Re-evaluation, 97535 self care/ADL training, 54098 therapeutic exercise, 97530 therapeutic activity, 97112  neuromuscular re-education, 97140 manual therapy, 97035 ultrasound, 97039 fluidotherapy, 97010 moist heat, 97010 cryotherapy, 97130 Cognitive training(each additional 15 min), 11914 Orthotics management and training, 78295 Splinting (initial encounter), coping strategies training, patient/family education, and DME and/or AE instructions  CONSULTED AND AGREED WITH PLAN OF CARE: Patient  PLAN FOR NEXT SESSION:   See her back after vacation and ensure that she is still making progress  and that she did not hurt herself on vacation consider discharge   Fannie Knee, OTR/L, CHT 05/11/2023, 11:41 AM

## 2023-05-11 ENCOUNTER — Ambulatory Visit: Admitting: Rehabilitative and Restorative Service Providers"

## 2023-05-11 ENCOUNTER — Encounter: Payer: Self-pay | Admitting: Rehabilitative and Restorative Service Providers"

## 2023-05-11 DIAGNOSIS — R202 Paresthesia of skin: Secondary | ICD-10-CM

## 2023-05-11 DIAGNOSIS — R278 Other lack of coordination: Secondary | ICD-10-CM

## 2023-05-11 DIAGNOSIS — R600 Localized edema: Secondary | ICD-10-CM

## 2023-05-11 DIAGNOSIS — M25641 Stiffness of right hand, not elsewhere classified: Secondary | ICD-10-CM

## 2023-05-11 DIAGNOSIS — M79641 Pain in right hand: Secondary | ICD-10-CM | POA: Diagnosis not present

## 2023-05-18 ENCOUNTER — Encounter: Admitting: Rehabilitative and Restorative Service Providers"

## 2023-05-24 NOTE — Therapy (Signed)
 OUTPATIENT OCCUPATIONAL THERAPY TREATMENT NOTE   Patient Name: Diana Case MRN: [id number] DOB:1955/09/22, 68 y.o., female Today's Date: 05/24/2023  PCP: Lupita Shutter MD REFERRING PROVIDER:  Samuella Cota, MD    END OF SESSION:    Past Medical History:  Diagnosis Date   Dysrhythmia    palpitations   Hypertension    MVP (mitral valve prolapse)    Past Surgical History:  Procedure Laterality Date   CESAREAN SECTION     DIAGNOSTIC LAPAROSCOPY     LAPAROSCOPIC ASSISTED VAGINAL HYSTERECTOMY N/A 12/26/2013   Procedure: LAPAROSCOPIC ASSISTED VAGINAL HYSTERECTOMY;  Surgeon: Sharon Seller, DO;  Location: [facility];  Service: Gynecology;  Laterality: N/A;   Patient Active Problem List   Diagnosis Date Noted   Avitaminosis D 01/28/2022   Dyslipidemia 12/27/2017   Chronic pain of left knee 05/20/2017   Essential hypertension 02/17/2017   Depression, major, single episode, complete remission (HCC) 02/17/2017   Retinal detachment, left 01/06/2012   Retinal tear 01/06/2012   Status post intraocular lens implant 01/06/2012   Recurrent HSV (herpes simplex virus) 08/27/2011    ONSET DATE: DOS 04/07/23  REFERRING DIAG: R22.31 (ICD-10-CM) - Finger mass, right   THERAPY DIAG:  No diagnosis found.  Rationale for Evaluation and Treatment: Rehabilitation  PERTINENT HISTORY: 3+ weeks status post right small finger mass excision.  Pathology indicates schwannoma, negative for malignancy.  She does have some residual numbness along the ulnar border of the small finger postoperatively, distal to the incisional site.  She states having a mass in her Rt hand SF and her finger was contracting for 2-3 years, and finally had sx removal.  She has an apparent flexed position and some complaints of pain, hypersensitivity and weakness.  She enjoys making pottery and is retired.   PRECAUTIONS: None  RED FLAGS: None   WEIGHT BEARING RESTRICTIONS: No   SUBJECTIVE:   SUBJECTIVE  STATEMENT: She states ***  less hypersensitive now.     PAIN:  Are you having pain? Yes: NPRS scale:  *** 0/10 pain at rest now, and up to 5/10 in past week at worst Pain location: Right small finger Pain description: Burning and hypersensitive Aggravating factors: Striking it Relieving factors: Rest   PATIENT GOALS: To improve use of right dominant hand  NEXT MD VISIT: As needed   OBJECTIVE: (All objective assessments below are from initial evaluation on: 05/02/23 unless otherwise specified.)   HAND DOMINANCE: Right   ADLs: Overall ADLs: States decreased ability to grab, hold household objects, pain and difficulty to open containers, perform FMS tasks (manipulate fasteners on clothing)   FUNCTIONAL OUTCOME MEASURES: Eval: Patient Specific Functional Scale: 3.8 (writing, pottery wheel, cooking)  (Higher Score  =  Better Ability for the Selected Tasks)       UPPER EXTREMITY ROM     Shoulder to Wrist AROM Right eval Left eval  Wrist flexion 69 62  Wrist extension 65 69  (Blank rows = not tested)   Hand AROM Right eval Rt 05/11/23 Rt 05/25/23  Full Fist Ability (or Gap to Distal Palmar Crease) Unable with small finger of right hand    Thumb Opposition  (Kapandji Scale)  Within functional limits    Little MCP (0-90) 0 - 78 0 - 90 *** - ***  Little PIP (0-100) (-12) - 91 (-7) - 87 *** - ***  Little DIP (0-70) 0-  69 0 - 79 *** - ***  (Blank rows = not tested)   UPPER EXTREMITY MMT:  Eval:  NT at eval due to recent and still healing injuries, but largely 3 -/5 in the right hand small finger today. Will be tested when appropriate.   MMT Right TBD  Shoulder flexion   Shoulder abduction   Shoulder adduction   Shoulder extension   Shoulder internal rotation   Shoulder external rotation   Middle trapezius   Lower trapezius   Elbow flexion   Elbow extension   Forearm supination   Forearm pronation   Wrist flexion   Wrist extension   Wrist ulnar deviation    Wrist radial deviation   (Blank rows = not tested)  HAND FUNCTION: 05/25/23: Grip Rt: ***#   Eval: Observed weakness in affected Rt hand.  Grip strength Right: 28 lbs, Left: 38 lbs   COORDINATION: Eval: Observed coordination impairments with affected right hand, as seen by inability to make a full composite fist.  Details will be tested when appropriate/as needed.  SENSATION: Eval: She has light touch ability though hypersensitive around sx area, also some areas that feel numb or tingly (mixed paresthesia)  EDEMA:   Eval:  Mildly swollen in right hand small finger today  OBSERVATIONS:   Eval: Somewhat passively correctable flexion contracture at small finger PIP joint, better ability to bend in composite fist  Hypersensitive and stiff Rt SF  TODAY'S TREATMENT:  05/25/23: *** See her back after vacation and ensure that she is still making progress and that she did not hurt herself on vacation consider discharge   05/11/23: She starts with active range of motion for exercise as well as new measures which does show improved performance now.  Her hypersensitivity is also greatly improved and she describes doing it correctly in her home exercises.  OT introduces her to a vibration today which can also help with nerve pain and a parent feedback to the brain.  She uses this manual therapy vibration for 3 to 5 minutes stating feeling great relief.  We discussed that she can use a soft personal brush to achieve the same effect at home.  We also work on nerve gliding for neuromuscular rehab/desensitization.  We reviewed the rest of her home exercise program which does not need much modification, but due to her remaining PIP joint extension lag OT does fabricate a small custom orthosis to extend the small finger PIP joint to be worn at night only on a light stretch.  She could wear it in the day for 15 to 30 minutes at a time on a medium stretch if she does not tolerate in the night.  She states  understanding all directions and leaves in no pain.  Additionally as she is leaving to go on vacation she was given safety/self-care information to avoid gripping and pulling heavy luggage, consider being mindful and while on vacation.   "Wear small brace in night on light stretch to help straighten finger.     (Pad goes up by nail) Can also wear in day for 15-30 mins on bigger stretch, but I prefer/suggest night time.   Be mindful on vacation.   Try brushing or vibration."   Exercises - Ulnar Nerve Flossing  - 3-4 x daily - 1-2 sets - 5-10 reps - Wrist Flexion Stretch  - 4 x daily - 2-3 reps - 15 sec hold - Wrist Prayer Stretch  - 4 x daily - 3-5 reps - 15 sec hold - Tendon Glides  - 4-6 x daily - 5 reps - 2-3 seconds hold - Hand AROM  Reverse Blocking  - 4-6 x daily - 10-15 reps - 2-3 sec hold - PUSH KNUCKLES DOWN  - 4 x daily - 3-5 reps - 15 seconds hold Patient Education - Rubbing with Different Textures    PATIENT EDUCATION: Education details: See tx section above for details  Person educated: Patient Education method: Verbal Instruction, Teach back, Handouts  Education comprehension: States and demonstrates understanding, Additional Education required    HOME EXERCISE PROGRAM: Access Code: [email or web address] URL: [email or web address] Date: 05/02/2023 Prepared by: Fannie Knee   GOALS: Goals reviewed with patient? Yes   SHORT TERM GOALS: (STG required if POC>30 days) Target Date: 05/13/2023  Pt will obtain custom mobilization orthosis. Goal status: TBD/PRN  2.  Pt will demo/state understanding of initial HEP to improve pain levels and prerequisite motion. Goal status: INITIAL   LONG TERM GOALS: Target Date: 06/03/23  Pt will improve functional ability by decreased impairment per PSFS assessment from 3.8 to 7 or better, for better quality of life. Goal status: INITIAL  2.  Pt will improve grip strength in right hand from 28 lbs to at least 35 lbs for  functional use at home and in IADLs. Goal status: INITIAL  3.  Pt will improve A/ROM in right hand small finger total active motion from 226 degrees to at least 235 degrees, to have functional motion for tasks like reach and grasp.  Goal status: INITIAL  4.  Pt will improve strength in right hand and wrist flexion/extension from apparent 3 -/5 MMT to at least 4+/5 MMT to have increased functional ability to carry out selfcare and higher-level homecare tasks with less difficulty. Goal status: INITIAL  5.  Pt will improve coordination skills in right dominant hand, as seen by within functional limit score on box and blocks testing to have increased functional ability to carry out more complex, coordinated IADLs (meal prep, sports, etc.).  Goal status: INITIAL  6.  Pt will decrease pain at worst from 5/10 to 2/10 or better to have better sleep and occupational participation in daily roles. Goal status: INITIAL   ASSESSMENT:  CLINICAL IMPRESSION: 05/25/23: ***  05/11/23: Making great improvements and likely will be able to discharge in the next 1-2 visits  Eval: Patient is a 68 y.o. female who was seen today for occupational therapy evaluation for hypersensitive, stiff, weak right hand small finger after surgical removal of neuroma.  She will benefit from outpatient occupational therapy to decrease symptoms, increase prerequisite skills to increase occupational performance.     PLAN:  OT FREQUENCY: 1x/week  OT DURATION: 4 weeks through 06/03/2023 and up to 5 total visits as needed  PLANNED INTERVENTIONS: 97168 OT Re-evaluation, 97535 self care/ADL training, 47829 therapeutic exercise, 97530 therapeutic activity, 97112 neuromuscular re-education, 97140 manual therapy, 97035 ultrasound, 97039 fluidotherapy, 97010 moist heat, 97010 cryotherapy, 97130 Cognitive training(each additional 15 min), 56213 Orthotics management and training, 08657 Splinting (initial encounter), coping strategies  training, patient/family education, and DME and/or AE instructions  CONSULTED AND AGREED WITH PLAN OF CARE: Patient  PLAN FOR NEXT SESSION:   ***   Fannie Knee, OTR/L, CHT 05/24/2023, 7:50 AM

## 2023-05-25 ENCOUNTER — Encounter: Payer: Self-pay | Admitting: Rehabilitative and Restorative Service Providers"

## 2023-05-25 ENCOUNTER — Ambulatory Visit: Admitting: Rehabilitative and Restorative Service Providers"

## 2023-05-25 DIAGNOSIS — R278 Other lack of coordination: Secondary | ICD-10-CM | POA: Diagnosis not present

## 2023-05-25 DIAGNOSIS — R600 Localized edema: Secondary | ICD-10-CM | POA: Diagnosis not present

## 2023-05-25 DIAGNOSIS — M79641 Pain in right hand: Secondary | ICD-10-CM | POA: Diagnosis not present

## 2023-05-25 DIAGNOSIS — M25641 Stiffness of right hand, not elsewhere classified: Secondary | ICD-10-CM

## 2023-05-25 DIAGNOSIS — R202 Paresthesia of skin: Secondary | ICD-10-CM

## 2023-06-01 ENCOUNTER — Encounter: Admitting: Rehabilitative and Restorative Service Providers"

## 2023-06-06 ENCOUNTER — Ambulatory Visit (INDEPENDENT_AMBULATORY_CARE_PROVIDER_SITE_OTHER): Payer: PPO | Admitting: Orthopedic Surgery

## 2023-06-06 DIAGNOSIS — R2231 Localized swelling, mass and lump, right upper limb: Secondary | ICD-10-CM

## 2023-06-06 NOTE — Progress Notes (Signed)
   Diana Case - 68 y.o. female MRN [id number]  Date of birth: [birth year]  Office Visit Note: Visit Date: 06/06/2023 PCP: Ardith Dark, MD Referred by: Ardith Dark, MD  Subjective:  HPI: Diana Case is a 68 y.o. female who presents today for follow up 8 weeks status post right small finger mass excision.  Pathology showed schwannoma, negative for malignancy.  Numbness along the ulnar border is improving of the hand.  Range of motion has improved as well.  She is doing home exercises instructed.  Pertinent ROS were reviewed with the patient and found to be negative unless otherwise specified above in HPI.   Assessment & Plan: Visit Diagnoses:  1. Finger mass, right     Plan: She continues to do well postoperatively.  I explained that the numbness along the ulnar border of the hand will take an extensive period of time for recovery.  Given the significant involvement of the mass with the ulnar digital nerve in this region, at this time it is reasonable to have some residual numbness.  Her range of motion has improved very well.  She is pleased with her outcome.  She would like to follow-up as needed which is appropriate.  Follow-up: No follow-ups on file.   Meds & Orders: No orders of the defined types were placed in this encounter.  No orders of the defined types were placed in this encounter.    Procedures: No procedures performed       Objective:   Vital Signs: There were no vitals taken for this visit.  Ortho Exam Right hand: - Well-healed incision - Digital range of motion small finger PIP 0-80, DIP 0-45 - Sensation intact to light touch at the P1 region ulnar border, distal to the incisional site unable to discern 2 point discrimination along the ulnar border, pressure sensation is intact  Imaging: No results found.   Suvi Archuletta Trevor Mace, M.D. [facility], Hand Surgery

## 2023-06-21 ENCOUNTER — Other Ambulatory Visit: Payer: Self-pay | Admitting: *Deleted

## 2023-06-21 MED ORDER — ATORVASTATIN CALCIUM 20 MG PO TABS: 20.0000 mg | ORAL_TABLET | Freq: Every day | ORAL | 3 refills | Status: DC

## 2023-07-04 ENCOUNTER — Ambulatory Visit (INDEPENDENT_AMBULATORY_CARE_PROVIDER_SITE_OTHER): Payer: PPO

## 2023-07-04 VITALS — Ht 63.0 in | Wt 130.0 lb

## 2023-07-04 DIAGNOSIS — Z0000 Encounter for general adult medical examination without abnormal findings: Secondary | ICD-10-CM

## 2023-07-04 NOTE — Progress Notes (Signed)
 Subjective:   Diana Case is a 68 y.o. who presents for a Medicare Wellness preventive visit.  Visit Complete: Virtual I connected with  Gi[place] on 07/04/23 by a audio enabled telemedicine application and verified that I am speaking with the correct person using two identifiers.  Patient Location: Home   Provider Location: Office/Clinic  I discussed the limitations of evaluation and management by telemedicine. The patient expressed understanding and agreed to proceed.  Vital Signs: Because this visit was a virtual/telehealth visit, some criteria may be missing or patient reported. Any vitals not documented were not able to be obtained and vitals that have been documented are patient reported.  VideoDeclined- This patient declined Librarian, academic. Therefore the visit was completed with audio only.  Persons Participating in Visit: Patient.  AWV Questionnaire: No: Patient Medicare AWV questionnaire was not completed prior to this visit.  Cardiac Risk Factors include: advanced age (>66men, >61 women);dyslipidemia;hypertension     Objective:    Today's Vitals   07/04/23 0852  Weight: 130 lb (59 kg)  Height: 5\' 3"  (1.6 m)   Body mass index is 23.03 kg/m.     07/04/2023    8:57 AM 05/02/2023    5:42 PM 06/28/2022    9:10 AM 05/27/2016   11:11 AM 12/26/2013    5:20 PM 12/21/2013    2:29 PM  Advanced Directives  Does Patient Have a Medical Advance Directive? No No No No No Yes;No  Does patient want to make changes to medical advance directive?     Yes - information given Yes - information given  Would patient like information on creating a medical advance directive? No - Patient declined No - Patient declined No - Patient declined No - Patient declined Yes - Educational materials given     Current Medications (verified) Outpatient Encounter Medications as of 07/04/2023  Medication Sig   atorvastatin  (LIPITOR) 20 MG tablet Take  1 tablet (20 mg total) by mouth daily.   Biotin 5000 MCG SUBL Place under the tongue.   buPROPion  (WELLBUTRIN  XL) 150 MG 24 hr tablet TAKE 1 TABLET(150 MG) BY MOUTH DAILY   ibuprofen  (ADVIL ,MOTRIN ) 200 MG tablet Take 400 mg by mouth as needed.   Vitamin D , Ergocalciferol , (DRISDOL ) 1.25 MG (50000 UNIT) CAPS capsule TAKE 1 CAPSULE BY MOUTH EVERY 7 DAYS   [DISCONTINUED] acetaminophen -codeine  (TYLENOL  #3) 300-30 MG tablet Take 1 tablet by mouth every 6 (six) hours as needed for moderate pain (pain score 4-6).   [DISCONTINUED] Melatonin 10 MG TABS Take by mouth.   No facility-administered encounter medications on file as of 07/04/2023.    Allergies (verified) Adhesive [tape]   History: Past Medical History:  Diagnosis Date   Dysrhythmia    palpitations   Hypertension    MVP (mitral valve prolapse)    Past Surgical History:  Procedure Laterality Date   CESAREAN SECTION     DIAGNOSTIC LAPAROSCOPY     LAPAROSCOPIC ASSISTED VAGINAL HYSTERECTOMY N/A 12/26/2013   Procedure: LAPAROSCOPIC ASSISTED VAGINAL HYSTERECTOMY;  Surgeon: Christel Cousins, DO;  Location: [facility];  Service: Gynecology;  Laterality: N/A;   History reviewed. No pertinent family history. Social History   Socioeconomic History   Marital status: Married    Spouse name: Not on file   Number of children: Not on file   Years of education: Not on file   Highest education level: Not on file  Occupational History   Not on file  Tobacco Use  Smoking status: Never   Smokeless tobacco: Never  Substance and Sexual Activity   Alcohol use: Yes    Comment: daily   Drug use: No   Sexual activity: Not on file  Other Topics Concern   Not on file  Social History Narrative   Not on file   Social Drivers of Health   Financial Resource Strain: Low Risk  (07/04/2023)   Overall Financial Resource Strain (CARDIA)    Difficulty of Paying Living Expenses: Not hard at all  Food Insecurity: No Food Insecurity (07/04/2023)   Hunger  Vital Sign    Worried About Running Out of Food in the Last Year: Never true    Ran Out of Food in the Last Year: Never true  Transportation Needs: No Transportation Needs (07/04/2023)   PRAPARE - Administrator, Civil Service (Medical): No    Lack of Transportation (Non-Medical): No  Physical Activity: Sufficiently Active (07/04/2023)   Exercise Vital Sign    Days of Exercise per Week: 5 days    Minutes of Exercise per Session: 30 min  Stress: No Stress Concern Present (07/04/2023)   [employer] of Occupational Health - Occupational Stress Questionnaire    Feeling of Stress : Only a little  Social Connections: Socially Integrated (07/04/2023)   Social Connection and Isolation Panel [NHANES]    Frequency of Communication with Friends and Family: More than three times a week    Frequency of Social Gatherings with Friends and Family: More than three times a week    Attends Religious Services: 1 to 4 times per year    Active Member of [employer] or Organizations: Yes    Attends Banker Meetings: 1 to 4 times per year    Marital Status: Married    Tobacco Counseling Counseling given: Not Answered    Clinical Intake:  Pre-visit preparation completed: Yes  Pain : No/denies pain     BMI - recorded: 23.03 Nutritional Status: BMI of 19-24  Normal Diabetes: No  Lab Results  Component Value Date   HGBA1C 5.9 02/03/2023   HGBA1C 5.8 01/28/2022   HGBA1C 6.0 01/27/2021     How often do you need to have someone help you when you read instructions, pamphlets, or other written materials from your doctor or pharmacy?: 1 - Never  Interpreter Needed?: No  Information entered by :: Lamont Pilsner, LPN   Activities of Daily Living     07/04/2023    8:54 AM  In your present state of health, do you have any difficulty performing the following activities:  Hearing? 0  Vision? 0  Difficulty concentrating or making decisions? 0  Walking or climbing stairs? 0   Dressing or bathing? 0  Doing errands, shopping? 0  Preparing Food and eating ? N  Using the Toilet? N  In the past six months, have you accidently leaked urine? N  Do you have problems with loss of bowel control? N  Managing your Medications? N  Managing your Finances? N  Housekeeping or managing your Housekeeping? N    Patient Care Team: Rodney Clamp, MD as PCP - General (Family Medicine) Mammography, [facility] (Diagnostic Radiology)  Indicate any recent Medical Services you may have received from other than [facility] providers in the past year (date may be approximate).     Assessment:   This is a routine wellness examination for [place].  Hearing/Vision screen Hearing Screening - Comments:: Pt denies any hearing issues  Vision Screening - Comments:: Wears  rx glasses - up to date with routine eye exams with was with [facility] will follow up with new provider     Goals Addressed             This Visit's Progress    Patient Stated       Lose weight        Depression Screen     07/04/2023    8:56 AM 02/03/2023    8:27 AM 02/03/2023    8:25 AM 06/28/2022    9:09 AM 01/28/2022    8:35 AM 08/06/2021    3:53 PM 01/27/2021    8:11 AM  PHQ 2/9 Scores  PHQ - 2 Score 1 0 0 1 0 0 0  PHQ- 9 Score  0   0 0 0    Fall Risk     07/04/2023    8:58 AM 02/03/2023    8:25 AM 06/28/2022    9:11 AM 01/28/2022    8:36 AM 10/29/2021    9:35 AM  Fall Risk   Falls in the past year? 0 0 0 0 0  Number falls in past yr: 0 0 0 0 0  Injury with Fall? 0 0 0 0 0  Risk for fall due to : No Fall Risks No Fall Risks Impaired vision No Fall Risks No Fall Risks  Follow up Falls prevention discussed  Falls prevention discussed  Falls evaluation completed    MEDICARE RISK AT HOME:  Medicare Risk at Home Any stairs in or around the home?: Yes If so, are there any without handrails?: No Home free of loose throw rugs in walkways, pet beds, electrical cords, etc?: Yes Adequate lighting in your home  to reduce risk of falls?: Yes Life alert?: No Use of a cane, walker or w/c?: No Grab bars in the bathroom?: No Shower chair or bench in shower?: No Elevated toilet seat or a handicapped toilet?: No  TIMED UP AND GO:  Was the test performed?  No  Cognitive Function: 6CIT completed        07/04/2023    8:59 AM 06/28/2022    9:12 AM  6CIT Screen  What Year? 0 points 0 points  What month? 0 points 0 points  What time? 0 points 0 points  Count back from 20 0 points 0 points  Months in reverse 0 points 0 points  Repeat phrase 0 points 0 points  Total Score 0 points 0 points    Immunizations Immunization History  Administered Date(s) Administered   Fluad Quad(high Dose 65+) 01/19/2019, 01/27/2021, 01/28/2022   Fluad Trivalent(High Dose 65+) 02/03/2023   Influenza,inj,Quad PF,6+ Mos 12/27/2013, 02/03/2015, 02/03/2016, 02/17/2017, 12/23/2017, 01/21/2020   Influenza,inj,quad, With Preservative 02/08/2013   Influenza-Unspecified 11/29/2011   Moderna Covid-19 Vaccine Bivalent Booster 24yrs & up 01/09/2021   Moderna Sars-Covid-2 Vaccination 04/16/2019, 05/13/2019, 01/01/2020, 09/12/2020   Pneumococcal Conjugate-13 01/19/2019   Pneumococcal Polysaccharide-23 01/21/2020    Screening Tests Health Maintenance  Topic Date Due   Fecal DNA (Cologuard)  Never done   Zoster Vaccines- Shingrix (1 of 2) Never done   COVID-19 Vaccine (6 - 2024-25 season) 10/31/2022   INFLUENZA VACCINE  09/30/2023   Medicare Annual Wellness (AWV)  07/03/2024   MAMMOGRAM  04/04/2025   Pneumonia Vaccine 7+ Years old  Completed   DEXA SCAN  Completed   Hepatitis C Screening  Completed   HPV VACCINES  Aged Out   Meningococcal B Vaccine  Aged Out   DTaP/Tdap/Td  Discontinued  Health Maintenance  Health Maintenance Due  Topic Date Due   Fecal DNA (Cologuard)  Never done   Zoster Vaccines- Shingrix (1 of 2) Never done   COVID-19 Vaccine (6 - 2024-25 season) 10/31/2022   Health Maintenance Items  Addressed: See Nurse Notes  Additional Screening:  Vision Screening: Recommended annual ophthalmology exams for early detection of glaucoma and other disorders of the eye.  Dental Screening: Recommended annual dental exams for proper oral hygiene  Community Resource Referral / Chronic Care Management: CRR required this visit?  No   CCM required this visit?  No     Plan:     I have personally reviewed and noted the following in the patient's chart:   Medical and social history Use of alcohol, tobacco or illicit drugs  Current medications and supplements including opioid prescriptions. Patient is not currently taking opioid prescriptions. Functional ability and status Nutritional status Physical activity Advanced directives List of other physicians Hospitalizations, surgeries, and ER visits in previous 12 months Vitals Screenings to include cognitive, depression, and falls Referrals and appointments  In addition, I have reviewed and discussed with patient certain preventive protocols, quality metrics, and best practice recommendations. A written personalized care plan for preventive services as well as general preventive health recommendations were provided to patient.     Bruno Capri, LPN   07/01/8411   After Visit Summary: (MyChart) Due to this being a telephonic visit, the after visit summary with patients personalized plan was offered to patient via MyChart   Notes: Nothing significant to report at this time.

## 2023-07-04 NOTE — Patient Instructions (Signed)
 Ms. Pliler , Thank you for taking time to come for your Medicare Wellness Visit. I appreciate your ongoing commitment to your health goals. Please review the following plan we discussed and let me know if I can assist you in the future.   Referrals/Orders/Follow-Ups/Clinician Recommendations: continue to work on losing weight   This is a list of the screening recommended for you and due dates:  Health Maintenance  Topic Date Due   Cologuard (Stool DNA test)  Never done   Zoster (Shingles) Vaccine (1 of 2) Never done   COVID-19 Vaccine (6 - 2024-25 season) 10/31/2022   Flu Shot  09/30/2023   Medicare Annual Wellness Visit  07/03/2024   Mammogram  04/04/2025   Pneumonia Vaccine  Completed   DEXA scan (bone density measurement)  Completed   Hepatitis C Screening  Completed   HPV Vaccine  Aged Out   Meningitis B Vaccine  Aged Out   DTaP/Tdap/Td vaccine  Discontinued    Advanced directives: (Declined) Advance directive discussed with you today. Even though you declined this today, please call our office should you change your mind, and we can give you the proper paperwork for you to fill out.  Next Medicare Annual Wellness Visit scheduled for next year: Yes  Have you seen your provider in the last 6 months (3 months if uncontrolled diabetes)? No last seen 02/03/23 Physical

## 2023-07-13 ENCOUNTER — Other Ambulatory Visit: Payer: Self-pay | Admitting: *Deleted

## 2023-07-13 MED ORDER — BUPROPION HCL ER (XL) 150 MG PO TB24: 150.0000 mg | ORAL_TABLET | Freq: Every day | ORAL | 0 refills | Status: DC

## 2023-07-21 DIAGNOSIS — F321 Major depressive disorder, single episode, moderate: Secondary | ICD-10-CM | POA: Diagnosis not present

## 2023-07-21 DIAGNOSIS — F4323 Adjustment disorder with mixed anxiety and depressed mood: Secondary | ICD-10-CM | POA: Diagnosis not present

## 2023-08-01 ENCOUNTER — Ambulatory Visit: Payer: Self-pay

## 2023-08-01 NOTE — Telephone Encounter (Signed)
 Noted

## 2023-08-01 NOTE — Telephone Encounter (Signed)
  Chief Complaint: left knee pain Symptoms: left knee moderate pain (dull, ache) Frequency: x 1.5-2 weeks Pertinent Negatives: Patient denies redness, fever, calf pain, chest pain, difficulty breathing Disposition: [] ED /[] Urgent Care (no appt availability in office) / [x] Appointment(In office/virtual)/ []  [facility]/ [] Home Care/ [] Refused Recommended Disposition /[] [facility]/ []  Follow-up with PCP Additional Notes: Patient states she has been treating the pain at home with ibuprofen . She also has been wearing a knee brace and she states that has helped. Patient scheduled for acute visit tomorrow with PCP.  Copied from CRM [id number]. Topic: Clinical - Red Word Triage >> Aug 01, 2023  8:53 AM El Gravely T wrote: [facility] that prompted transfer to Nurse Triage: Left knee pain and popping, painful walking, sitting, etc Reason for Disposition  [1] MODERATE pain (e.g., interferes with normal activities, limping) AND [2] present > 3 days  Answer Assessment - Initial Assessment Questions 1. LOCATION and RADIATION: "Where is the pain located?"      Left knee.  2. QUALITY: "What does the pain feel like?"  (e.g., sharp, dull, aching, burning)     Dull, ache.  3. SEVERITY: "How bad is the pain?" "What does it keep you from doing?"   (Scale 1-10; or mild, moderate, severe)   -  MILD (1-3): doesn't interfere with normal activities    -  MODERATE (4-7): interferes with normal activities (e.g., work or school) or awakens from sleep, limping    -  SEVERE (8-10): excruciating pain, unable to do any normal activities, unable to walk     5/10.   4. ONSET: "When did the pain start?" "Does it come and go, or is it there all the time?"     X 1.5- 2 weeks.  5. RECURRENT: "Have you had this pain before?" If Yes, ask: "When, and what happened then?"     Yes, 2 years ago the right knee similar pain. She states she was told her meniscus had issues.  6. SETTING: "Has there been any recent  work, exercise or other activity that involved that part of the body?"      Gardening.  7. AGGRAVATING FACTORS: "What makes the knee pain worse?" (e.g., walking, climbing stairs, running)     Walking, standing for a long period, sitting.  8. ASSOCIATED SYMPTOMS: "Is there any swelling or redness of the knee?"     Slight swelling, no redness.  9. OTHER SYMPTOMS: "Do you have any other symptoms?" (e.g., chest pain, difficulty breathing, fever, calf pain)     No.  10. PREGNANCY: "Is there any chance you are pregnant?" "When was your last menstrual period?"       N/A.  Protocols used: Knee Pain-A-AH

## 2023-08-02 ENCOUNTER — Ambulatory Visit (INDEPENDENT_AMBULATORY_CARE_PROVIDER_SITE_OTHER): Admitting: Family Medicine

## 2023-08-02 ENCOUNTER — Encounter: Payer: Self-pay | Admitting: Family Medicine

## 2023-08-02 VITALS — BP 138/80 | HR 88 | Temp 98.6°F | Ht 63.0 in | Wt 131.8 lb

## 2023-08-02 DIAGNOSIS — Z1211 Encounter for screening for malignant neoplasm of colon: Secondary | ICD-10-CM

## 2023-08-02 DIAGNOSIS — I10 Essential (primary) hypertension: Secondary | ICD-10-CM

## 2023-08-02 DIAGNOSIS — F325 Major depressive disorder, single episode, in full remission: Secondary | ICD-10-CM | POA: Diagnosis not present

## 2023-08-02 DIAGNOSIS — M25562 Pain in left knee: Secondary | ICD-10-CM

## 2023-08-02 MED ORDER — DICLOFENAC SODIUM 75 MG PO TBEC: 75.0000 mg | DELAYED_RELEASE_TABLET | Freq: Two times a day (BID) | ORAL | 0 refills | Status: AC

## 2023-08-02 NOTE — Patient Instructions (Signed)
 It was very nice to see you today!  I think you have a flareup of arthritis in your knee.  You may have a little meniscal tear as well.  Start the diclofenac .  Work on the exercises.  Use ice and compression to the area.  Let us  know if not improving in the next 1 to 2 weeks.  Return if symptoms worsen or fail to improve.   Take care, Dr Daneil Dunker  PLEASE NOTE:  If you had any lab tests, please let us  know if you have not heard back within a few days. You may see your results on mychart before we have a chance to review them but we will give you a call once they are reviewed by us .   If we ordered any referrals today, please let us  know if you have not heard from their office within the next week.   If you had any urgent prescriptions sent in today, please check with the pharmacy within an hour of our visit to make sure the prescription was transmitted appropriately.   Please try these tips to maintain a healthy lifestyle:  Eat at least 3 REAL meals and 1-2 snacks per day.  Aim for no more than 5 hours between eating.  If you eat breakfast, please do so within one hour of getting up.   Each meal should contain half fruits/vegetables, one quarter protein, and one quarter carbs (no bigger than a computer mouse)  Cut down on sweet beverages. This includes juice, soda, and sweet tea.   Drink at least 1 glass of water with each meal and aim for at least 8 glasses per day  Exercise at least 150 minutes every week.

## 2023-08-02 NOTE — Assessment & Plan Note (Signed)
 She has been under more stress recently due to the declining health of her aging parents however overall symptoms are currently manageable.  She is on Wellbutrin  150 mg daily and seeing a therapist.  She will let us  know if she needs any further assistance.

## 2023-08-02 NOTE — Assessment & Plan Note (Signed)
Stable off medications.  

## 2023-08-02 NOTE — Progress Notes (Signed)
   Diana Case is a 68 y.o. female who presents today for an office visit.  Assessment/Plan:  New/Acute Problems: Left knee pain No red flags.  Overall reassuring exam.  Likely due to osteoarthritis flare.  She did have imaging few years ago which demonstrated degenerative changes.  May have small degenerative meniscal tear as well.  Will manage conservatively for now.  We discussed home exercise program and handout was given.  Will start diclofenac .  She has done well with this previously.  Also recommended ice and compression.  She will let us  know if not improving over the next 1 to 2 weeks and would consider referral to PT or sports medicine at that time.  Chronic Problems Addressed Today: Depression, major, single episode, complete remission (HCC) She has been under more stress recently due to the declining health of her aging parents however overall symptoms are currently manageable.  She is on Wellbutrin  150 mg daily and seeing a therapist.  She will let us  know if she needs any further assistance.  Essential hypertension Stable off medications.   Preventative health care-will refer for colonoscopy.   Subjective:  HPI:  See Assessment / plan for status of chronic conditions.  Patient is here today with knee pain.  She thinks that she aggravated doing more gardening and pulling weeds recently. This has been going on for a few weeks. Ibuprofen  helps some. Ice does not help much. No obvious injuries or precipitating events.  She had symptoms similar few years ago and was treated with diclofenac . She did well with this.  Also does admit to being under more stress recently due to her aging parents who are both in the late 90s.  She is currently working with a therapist which is going well.  She does feel like the Wellbutrin  has been beneficial.       Objective:  Physical Exam: BP 138/80   Pulse 88   Temp 98.6 F (37 C) (Temporal)   Ht 5\' 3"  (1.6 m)   Wt 131 lb 16.1 oz  (59.8 kg)   SpO2 96%   BMI 23.35 kg/m   Gen: No acute distress, resting comfortably MUSCULOSKELETAL - Left Knee: No deformities.  Mild tenderness palpation along lateral joint line.  Crepitus and clicking noted with active and passive extension.  Neurovascular intact distally.  Stable to varus and valgus stress.  Anterior posterior signs negative. Neuro: Grossly normal, moves all extremities Psych: Normal affect and thought content      Krithi Bray M. Daneil Dunker, MD 08/02/2023 11:31 AM

## 2023-08-03 DIAGNOSIS — F4323 Adjustment disorder with mixed anxiety and depressed mood: Secondary | ICD-10-CM | POA: Diagnosis not present

## 2023-08-03 DIAGNOSIS — F321 Major depressive disorder, single episode, moderate: Secondary | ICD-10-CM | POA: Diagnosis not present

## 2023-08-11 ENCOUNTER — Encounter: Payer: Self-pay | Admitting: Family Medicine

## 2023-08-12 ENCOUNTER — Other Ambulatory Visit: Payer: Self-pay | Admitting: *Deleted

## 2023-08-12 DIAGNOSIS — M25569 Pain in unspecified knee: Secondary | ICD-10-CM

## 2023-08-16 NOTE — Progress Notes (Signed)
 [name], PhD, LAT, ATC acting as a scribe for Garlan Juniper, MD.  Diana Case is a 68 y.o. female who presents to [facility] at [facility] today for L knee pain x 1 month. She relates the pain starting around the time when she was pulling weeds. Pt locates pain to the anterior-lateral aspect. She notes a similar pain back in 2019.  She notes she is currently in the process of moving her parents into assisted living, both suffering from dementia.  L Knee swelling: slight Mechanical symptoms: yes Aggravates: prolonged sitting or standing Treatments tried: oral diclofenac , ice, brace  Dx imaging: 05/20/17 L knee XR  Pertinent review of systems: No fevers or chills  Relevant historical information: Hypertension History of depression treated with Wellbutrin .  Currently receiving counseling.  Social determinants of health: Patient is elderly parents are suffering for dementia and in the process of moving to an assisted living facility.  Her father is very much against this but it needs to happen which is causing a great deal of stress.  Exam:  BP (!) 168/92   Pulse (!) 102   Ht 5' 3 (1.6 m)   Wt 131 lb (59.4 kg)   SpO2 98%   BMI 23.21 kg/m  General: Well Developed, well nourished, and in no acute distress.   MSK: Left knee: Normal appearing Normal motion. Tender palpation lateral joint line. Stable ligamentous exam. Intact strength.    Lab and Radiology Results  Procedure: Real-time Ultrasound Guided Injection of left knee joint superior lateral patella space Device: Philips Affiniti 50G/GE Logiq Images permanently stored and available for review in PACS Ultrasound evaluation prior to injection reveals degeneration at the lateral joint line with a large osteophyte approaching the IT band Verbal informed consent obtained.  Discussed risks and benefits of procedure. Warned about infection, bleeding, hyperglycemia damage to structures among  others. Patient expresses understanding and agreement Time-out conducted.   Noted no overlying erythema, induration, or other signs of local infection.   Skin prepped in a sterile fashion.   Local anesthesia: Topical Ethyl chloride.   With sterile technique and under real time ultrasound guidance: 40 mg of Kenalog and 2 mL of Marcaine  injected into knee joint. Fluid seen entering the joint capsule.   Completed without difficulty   Pain immediately resolved suggesting accurate placement of the medication.   Advised to call if fevers/chills, erythema, induration, drainage, or persistent bleeding.   Images permanently stored and available for review in the ultrasound unit.  Impression: Technically successful ultrasound guided injection.     X-ray images left knee obtained today personally and independently interpreted. Mild medial DJD and patellofemoral DJD.  No acute fractures are visible. Await formal radiology review    Assessment and Plan: 68 y.o. female with left lateral knee pain due to either exacerbation of DJD or possibly impingement of the LCL or IT band at the lateral side of the knee.  Plan for conventional steroid injection Voltaren  gel and advance activity as tolerated.  If not improving she will let me know and I will add physical therapy.  If that does not work she can return to clinic for considered injection directly around that osteophyte at the lateral knee.  Additionally we talked about her appearance.  She has a fair amount of anxiety and stress as she is in her parents to move into an assisted living facility.  She does have a pre-existing diagnosis of major depression which has been pretty well-controlled  with counseling and Wellbutrin .  Her increasing anxiety would meet criteria for adjustment disorder.  She may benefit from SSRI I advised contacting her primary care provider to consider this.  One of her children takes Prozac and the other  takes Lexapro and the both  tolerate it well.  I am hopeful that as her knee pain hurts less she is able to exercise a little more which should help her mood some.   PDMP not reviewed this encounter. Orders Placed This Encounter  Procedures   US  LIMITED JOINT SPACE STRUCTURES LOW LEFT(NO LINKED CHARGES)    Reason for Exam (SYMPTOM  OR DIAGNOSIS REQUIRED):   left knee pain    Preferred imaging location?:   [facility] Knee AP/LAT W/[place] Left    Standing Status:   Future    Number of Occurrences:   1    Expiration Date:   09/16/2023    Reason for Exam (SYMPTOM  OR DIAGNOSIS REQUIRED):   left knee pain    Preferred imaging location?:   [facility] [place]   US  LIMITED JOINT SPACE STRUCTURES LOW LEFT(NO LINKED CHARGES)    Reason for Exam (SYMPTOM  OR DIAGNOSIS REQUIRED):   left knee pain    Preferred imaging location?:   [facility]   No orders of the defined types were placed in this encounter.    Discussed warning signs or symptoms. Please see discharge instructions. Patient expresses understanding.   The above documentation has been reviewed and is accurate and complete Garlan Juniper, M.D.

## 2023-08-17 ENCOUNTER — Other Ambulatory Visit: Payer: Self-pay

## 2023-08-17 ENCOUNTER — Encounter: Payer: Self-pay | Admitting: Family Medicine

## 2023-08-17 ENCOUNTER — Ambulatory Visit: Admitting: Family Medicine

## 2023-08-17 ENCOUNTER — Ambulatory Visit (INDEPENDENT_AMBULATORY_CARE_PROVIDER_SITE_OTHER)

## 2023-08-17 VITALS — BP 168/92 | HR 102 | Ht 63.0 in | Wt 131.0 lb

## 2023-08-17 DIAGNOSIS — F325 Major depressive disorder, single episode, in full remission: Secondary | ICD-10-CM | POA: Diagnosis not present

## 2023-08-17 DIAGNOSIS — Z636 Dependent relative needing care at home: Secondary | ICD-10-CM

## 2023-08-17 DIAGNOSIS — M25562 Pain in left knee: Secondary | ICD-10-CM

## 2023-08-17 DIAGNOSIS — M25462 Effusion, left knee: Secondary | ICD-10-CM | POA: Diagnosis not present

## 2023-08-17 DIAGNOSIS — G8929 Other chronic pain: Secondary | ICD-10-CM | POA: Diagnosis not present

## 2023-08-17 DIAGNOSIS — F4323 Adjustment disorder with mixed anxiety and depressed mood: Secondary | ICD-10-CM | POA: Diagnosis not present

## 2023-08-17 DIAGNOSIS — M1712 Unilateral primary osteoarthritis, left knee: Secondary | ICD-10-CM | POA: Diagnosis not present

## 2023-08-17 NOTE — Patient Instructions (Addendum)
 Thank you for coming in today.   Please get an Xray today before you leave   Please use Voltaren  gel (Generic Diclofenac  Gel) up to 4x daily for pain as needed.  This is available over-the-counter as both the name brand Voltaren  gel and the generic diclofenac  gel.   You received an injection today. Seek immediate medical attention if the joint becomes red, extremely painful, or is oozing fluid.   Let me know if you would like a referral to physical therapy

## 2023-08-19 ENCOUNTER — Ambulatory Visit: Payer: Self-pay | Admitting: Family Medicine

## 2023-08-19 NOTE — Progress Notes (Signed)
 Left knee x-ray shows medium to severe arthritis similar to previous x-rays.

## 2023-08-21 ENCOUNTER — Encounter: Payer: Self-pay | Admitting: Family Medicine

## 2023-08-22 ENCOUNTER — Other Ambulatory Visit: Payer: Self-pay | Admitting: *Deleted

## 2023-08-22 NOTE — Telephone Encounter (Signed)
 Spoke with patient stated blood pressure elevated and anxiety. Advise to schedule an office visit with pcp  Appt schedule

## 2023-08-23 ENCOUNTER — Encounter: Payer: Self-pay | Admitting: Family Medicine

## 2023-08-23 ENCOUNTER — Ambulatory Visit (INDEPENDENT_AMBULATORY_CARE_PROVIDER_SITE_OTHER): Admitting: Family Medicine

## 2023-08-23 VITALS — BP 129/76 | HR 96 | Temp 98.1°F | Ht 63.0 in | Wt 127.2 lb

## 2023-08-23 DIAGNOSIS — F325 Major depressive disorder, single episode, in full remission: Secondary | ICD-10-CM

## 2023-08-23 DIAGNOSIS — M25562 Pain in left knee: Secondary | ICD-10-CM | POA: Diagnosis not present

## 2023-08-23 DIAGNOSIS — F4323 Adjustment disorder with mixed anxiety and depressed mood: Secondary | ICD-10-CM | POA: Diagnosis not present

## 2023-08-23 DIAGNOSIS — G8929 Other chronic pain: Secondary | ICD-10-CM

## 2023-08-23 MED ORDER — MELOXICAM 15 MG PO TABS: 15.0000 mg | ORAL_TABLET | Freq: Every day | ORAL | 0 refills | Status: DC

## 2023-08-23 MED ORDER — ESCITALOPRAM OXALATE 10 MG PO TABS: 10.0000 mg | ORAL_TABLET | Freq: Every day | ORAL | 3 refills | Status: AC

## 2023-08-23 NOTE — Progress Notes (Signed)
   Diana Case is a 68 y.o. female who presents today for an office visit.  Assessment/Plan:  Chronic Problems Addressed Today: Depression, major, single episode, complete remission (HCC) She has been under more stress recently due to her caregiver burden related to aging parents.  We did discuss additional treatment options.  Will continue her Wellbutrin  150 mg daily and start Lexapro 10 mg daily.  She is also working with a therapist which is beneficial as well.  We discussed potential side effects of Lexapro.  She will follow-up with us  in a few weeks and we can adjust the dose of medications as needed.  Chronic pain of left knee Recent x-ray showed moderate to severe osteoarthritis.  She had improvement with steroid injection last week.  We will switch her diclofenac  to meloxicam as she feels like the diclofenac  runs out quicker than she would like.  It is also okay for her to use Tylenol  1000 mg 3 times daily as needed with the meloxicam.  Adjustment disorder with mixed anxiety and depressed mood See depression A/P.  Continue Wellbutrin  and add on Lexapro 10 mg daily.  She will continue working with a therapist as well.     Subjective:  HPI:  See A/P for status of chronic conditions.  Patient is here today to discuss anxiety.  I last saw her a few weeks ago.  At that time we discussed her left knee pain and we referred her to sports medicine.  She received cortisone injection while there.  Knee pain has improved.  Her primary concern today is anxious mood.  She has a significant mount of caregiver burden due to her aging parents are both dealing with dementia.  We discussed this at previous office visits and she is currently seeing a therapist though she is interested in adding on an SSRI at this point.  She has children who are on Prozac and Lexapro and they tolerated these well.       Objective:  Physical Exam: BP 129/76   Pulse 96   Temp 98.1 F (36.7 C) (Temporal)   Ht  5' 3 (1.6 m)   Wt 127 lb 3.2 oz (57.7 kg)   SpO2 97%   BMI 22.53 kg/m   Gen: No acute distress, resting comfortably Neuro: Grossly normal, moves all extremities Psych: Normal affect and thought content      Diana Neville M. Kennyth, MD 08/23/2023 10:08 AM

## 2023-08-23 NOTE — Assessment & Plan Note (Signed)
 Recent x-ray showed moderate to severe osteoarthritis.  She had improvement with steroid injection last week.  We will switch her diclofenac  to meloxicam as she feels like the diclofenac  runs out quicker than she would like.  It is also okay for her to use Tylenol  1000 mg 3 times daily as needed with the meloxicam.

## 2023-08-23 NOTE — Patient Instructions (Addendum)
 It was very nice to see you today!  We will start Lexapro.   I will also send prescription in for meloxicam.  Please send me a message in a few weeks to let me know how you are doing.  Return in about 4 weeks (around 09/20/2023).   Take care, Dr Kennyth  PLEASE NOTE:  If you had any lab tests, please let us  know if you have not heard back within a few days. You may see your results on mychart before we have a chance to review them but we will give you a call once they are reviewed by us .   If we ordered any referrals today, please let us  know if you have not heard from their office within the next week.   If you had any urgent prescriptions sent in today, please check with the pharmacy within an hour of our visit to make sure the prescription was transmitted appropriately.   Please try these tips to maintain a healthy lifestyle:  Eat at least 3 REAL meals and 1-2 snacks per day.  Aim for no more than 5 hours between eating.  If you eat breakfast, please do so within one hour of getting up.   Each meal should contain half fruits/vegetables, one quarter protein, and one quarter carbs (no bigger than a computer mouse)  Cut down on sweet beverages. This includes juice, soda, and sweet tea.   Drink at least 1 glass of water with each meal and aim for at least 8 glasses per day  Exercise at least 150 minutes every week.

## 2023-08-23 NOTE — Assessment & Plan Note (Signed)
 See depression A/P.  Continue Wellbutrin  and add on Lexapro 10 mg daily.  She will continue working with a therapist as well.

## 2023-08-23 NOTE — Assessment & Plan Note (Signed)
 She has been under more stress recently due to her caregiver burden related to aging parents.  We did discuss additional treatment options.  Will continue her Wellbutrin  150 mg daily and start Lexapro 10 mg daily.  She is also working with a therapist which is beneficial as well.  We discussed potential side effects of Lexapro.  She will follow-up with us  in a few weeks and we can adjust the dose of medications as needed.

## 2023-08-24 DIAGNOSIS — F4323 Adjustment disorder with mixed anxiety and depressed mood: Secondary | ICD-10-CM | POA: Diagnosis not present

## 2023-08-24 DIAGNOSIS — F321 Major depressive disorder, single episode, moderate: Secondary | ICD-10-CM | POA: Diagnosis not present

## 2023-09-19 ENCOUNTER — Encounter: Payer: Self-pay | Admitting: Pharmacist

## 2023-09-19 ENCOUNTER — Other Ambulatory Visit: Payer: Self-pay | Admitting: *Deleted

## 2023-09-19 MED ORDER — MELOXICAM 15 MG PO TABS
15.0000 mg | ORAL_TABLET | Freq: Every day | ORAL | 0 refills | Status: DC
Start: 2023-09-19 — End: 2023-10-19

## 2023-09-19 NOTE — Progress Notes (Signed)
 Pharmacy Quality Measure Review  This patient is appearing on a report for being at risk of failing the adherence measure for cholesterol (statin) medications this calendar year.   Medication: atorvastatin  20mg  Last fill date: 06/21/2023 for 30 day supply per adherence report  Reviewed recent refill history. Patient filled atorvastatin  again 08/02/2023 for 90 day supply. Was sold by [employer] on same date.    Insurance report was not up to date. No action needed at this time.   Madelin Ray, PharmD Clinical Pharmacist [facility]  Population Health [phone]

## 2023-10-18 ENCOUNTER — Other Ambulatory Visit: Payer: Self-pay | Admitting: *Deleted

## 2023-10-18 MED ORDER — BUPROPION HCL ER (XL) 150 MG PO TB24: 150.0000 mg | ORAL_TABLET | Freq: Every day | ORAL | 0 refills | Status: DC

## 2023-10-19 ENCOUNTER — Other Ambulatory Visit: Payer: Self-pay | Admitting: *Deleted

## 2023-10-19 MED ORDER — MELOXICAM 15 MG PO TABS: 15.0000 mg | ORAL_TABLET | Freq: Every day | ORAL | 0 refills | Status: DC

## 2023-10-28 ENCOUNTER — Other Ambulatory Visit: Payer: Self-pay | Admitting: *Deleted

## 2023-10-28 MED ORDER — ATORVASTATIN CALCIUM 20 MG PO TABS: 20.0000 mg | ORAL_TABLET | Freq: Every day | ORAL | 3 refills | Status: DC

## 2023-11-17 ENCOUNTER — Other Ambulatory Visit: Payer: Self-pay | Admitting: Family Medicine

## 2023-12-20 ENCOUNTER — Other Ambulatory Visit: Payer: Self-pay | Admitting: Family Medicine

## 2024-01-02 ENCOUNTER — Encounter: Payer: Self-pay | Admitting: Radiology

## 2024-01-16 ENCOUNTER — Other Ambulatory Visit: Payer: Self-pay | Admitting: Family Medicine

## 2024-02-02 ENCOUNTER — Other Ambulatory Visit: Payer: Self-pay | Admitting: Family Medicine

## 2024-03-02 ENCOUNTER — Other Ambulatory Visit: Payer: Self-pay | Admitting: Family Medicine

## 2024-03-05 ENCOUNTER — Encounter: Payer: PPO | Admitting: Family Medicine

## 2024-03-29 ENCOUNTER — Emergency Department (HOSPITAL_BASED_OUTPATIENT_CLINIC_OR_DEPARTMENT_OTHER): Admission: EM | Admit: 2024-03-29 | Discharge: 2024-03-29 | Disposition: A | Discharge status: Home / Self Care

## 2024-03-29 ENCOUNTER — Other Ambulatory Visit: Payer: Self-pay

## 2024-03-29 ENCOUNTER — Encounter (HOSPITAL_BASED_OUTPATIENT_CLINIC_OR_DEPARTMENT_OTHER): Payer: Self-pay | Admitting: Emergency Medicine

## 2024-03-29 ENCOUNTER — Other Ambulatory Visit (HOSPITAL_BASED_OUTPATIENT_CLINIC_OR_DEPARTMENT_OTHER): Payer: Self-pay

## 2024-03-29 ENCOUNTER — Emergency Department (HOSPITAL_BASED_OUTPATIENT_CLINIC_OR_DEPARTMENT_OTHER)

## 2024-03-29 DIAGNOSIS — M25511 Pain in right shoulder: Secondary | ICD-10-CM | POA: Diagnosis not present

## 2024-03-29 DIAGNOSIS — R55 Syncope and collapse: Secondary | ICD-10-CM | POA: Insufficient documentation

## 2024-03-29 DIAGNOSIS — K449 Diaphragmatic hernia without obstruction or gangrene: Secondary | ICD-10-CM | POA: Diagnosis not present

## 2024-03-29 DIAGNOSIS — I700 Atherosclerosis of aorta: Secondary | ICD-10-CM | POA: Diagnosis not present

## 2024-03-29 DIAGNOSIS — Y92003 Bedroom of unspecified non-institutional (private) residence as the place of occurrence of the external cause: Secondary | ICD-10-CM | POA: Diagnosis not present

## 2024-03-29 DIAGNOSIS — W19XXXA Unspecified fall, initial encounter: Secondary | ICD-10-CM | POA: Insufficient documentation

## 2024-03-29 DIAGNOSIS — S0990XA Unspecified injury of head, initial encounter: Secondary | ICD-10-CM | POA: Diagnosis present

## 2024-03-29 DIAGNOSIS — S299XXA Unspecified injury of thorax, initial encounter: Secondary | ICD-10-CM | POA: Diagnosis present

## 2024-03-29 DIAGNOSIS — I10 Essential (primary) hypertension: Secondary | ICD-10-CM | POA: Insufficient documentation

## 2024-03-29 DIAGNOSIS — F10920 Alcohol use, unspecified with intoxication, uncomplicated: Secondary | ICD-10-CM | POA: Diagnosis present

## 2024-03-29 DIAGNOSIS — S2241XA Multiple fractures of ribs, right side, initial encounter for closed fracture: Secondary | ICD-10-CM | POA: Diagnosis not present

## 2024-03-29 MED ORDER — ACETAMINOPHEN 325 MG PO TABS
650.0000 mg | ORAL_TABLET | Freq: Once | ORAL | Status: AC
  Administered 2024-03-29: 650 mg via ORAL
  Filled 2024-03-29: qty 2

## 2024-03-29 MED ORDER — LIDOCAINE 5 % EX PTCH
1.0000 | MEDICATED_PATCH | CUTANEOUS | 0 refills | Status: DC
  Filled 2024-03-29: qty 14, 14d supply, fill #0

## 2024-03-29 MED ORDER — OXYCODONE HCL 5 MG PO TABS
5.0000 mg | ORAL_TABLET | Freq: Once | ORAL | Status: AC
  Administered 2024-03-29: 5 mg via ORAL
  Filled 2024-03-29: qty 1

## 2024-03-29 NOTE — ED Provider Notes (Signed)
 " [place] EMERGENCY DEPARTMENT AT [facility] Provider Note   CSN: [id number] Arrival date & time: 03/29/24  1007     Patient presents with: Diana Case is a 69 y.o. female.   69 year old female presents today for persistent right sided chest wall pain since falling on Friday.  She states she had too much to drink and she needed to go to the bathroom so when she stood up she fell.  She does endorse losing consciousness.  Denies loss of consciousness before falling.  Not on any blood thinning medication.  She states she is a heavy drinker and drinks on a daily basis.  Last drink was around 4 PM yesterday.  Denies any withdrawal symptoms.  Denies any significant pleuritic pain.  No other joint pain.  Denies any neck pain.  The history is provided by the patient. No language interpreter was used.       Prior to Admission medications  Medication Sig Start Date End Date Taking? Authorizing Provider  atorvastatin  (LIPITOR) 20 MG tablet TAKE 1 TABLET(20 MG) BY MOUTH DAILY 03/05/24   Kennyth Worth HERO, MD  Biotin 5000 MCG SUBL Place under the tongue.    [provider]  buPROPion  (WELLBUTRIN  XL) 150 MG 24 hr tablet TAKE 1 TABLET(150 MG) BY MOUTH DAILY 01/16/24   Kennyth Worth HERO, MD  diclofenac  (VOLTAREN ) 75 MG EC tablet Take 1 tablet (75 mg total) by mouth 2 (two) times daily. 08/02/23   Kennyth Worth HERO, MD  escitalopram  (LEXAPRO ) 10 MG tablet Take 1 tablet (10 mg total) by mouth daily. 08/23/23   Kennyth Worth HERO, MD  meloxicam  (MOBIC ) 15 MG tablet TAKE 1 TABLET(15 MG) BY MOUTH DAILY 03/05/24   Kennyth Worth HERO, MD  Vitamin D , Ergocalciferol , (DRISDOL ) 1.25 MG (50000 UNIT) CAPS capsule TAKE 1 CAPSULE BY MOUTH EVERY 7 DAYS 02/04/22   Kennyth Worth HERO, MD    Allergies: Adhesive [tape]    Review of Systems  Constitutional:  Negative for fever.  Respiratory:  Negative for shortness of breath.   Cardiovascular:  Positive for chest pain (Chest wall pain.  Right-sided.).   Neurological:  Negative for light-headedness.  All other systems reviewed and are negative.   Updated Vital Signs BP (!) 167/102   Pulse (!) 103   Temp 97.9 F (36.6 C) (Oral)   Resp 20   Ht 5' 3 (1.6 m)   Wt 56.7 kg   SpO2 98%   BMI 22.14 kg/m   Physical Exam Vitals and nursing note reviewed.  Constitutional:      General: She is not in acute distress.    Appearance: Normal appearance. She is not ill-appearing.  HENT:     Head: Normocephalic and atraumatic.     Nose: Nose normal.  Eyes:     Conjunctiva/sclera: Conjunctivae normal.  Cardiovascular:     Rate and Rhythm: Normal rate and regular rhythm.  Pulmonary:     Effort: Pulmonary effort is normal. No respiratory distress.  Musculoskeletal:        General: No deformity. Normal range of motion.     Comments: Cervical, thoracic, lumbar spine without tenderness to palpation or step-offs.  There is bruising noted to chest wall on the right side that extends from the anterior chest wall to the posterior.  All major joints of upper and lower extremity with good range of motion and good strength.  Mild tenderness to the right shoulder but has good range of motion and good  strength in the right shoulder.  Skin:    Findings: No rash.  Neurological:     Mental Status: She is alert.     (all labs ordered are listed, but only abnormal results are displayed) Labs Reviewed - No data to display  EKG: None  Radiology: No results found.   Procedures   Medications Ordered in the ED  oxyCODONE  (Oxy IR/ROXICODONE ) immediate release tablet 5 mg (has no administration in time range)  acetaminophen  (TYLENOL ) tablet 650 mg (has no administration in time range)    Clinical Course as of 03/29/24 1145  Thu Mar 29, 2024  1144 CT chest last shows evidence of 10th rib fracture on the right, and potentially a subtle 11 rib fracture.  Pain improved after the medicines she received earlier.  Will give her incentive spirometry.   Discussed that given her alcohol use that narcotic pain medicines will not be best to take at home.  She is in agreement.  Discussed use of Tylenol , ibuprofen  and will prescribe her Lidoderm  patches.  Will discharge.  Discussed close follow-up with PCP for reevaluation. [AA]    Clinical Course User Index [AA] Hildegard Loge, PA-C                                 Medical Decision Making Amount and/or Complexity of Data Reviewed Radiology: ordered.  Risk OTC drugs. Prescription drug management.   Medical Decision Making / ED Course   This patient presents to the ED for concern of fall, this involves an extensive number of treatment options, and is a complaint that carries with it a high risk of complications and morbidity.  The differential diagnosis includes fracture, contusion, intracranial bleed  MDM: 69 year old female presents after a fall that occurred 2 days ago.  Not on any blood thinning medication.  Does have bruising to the right side of her chest wall.  Will obtain CT chest without contrast, CT head and CT neck.  Will give pain control.  Will reevaluate.  Spine well aligned and without step-offs.  CT head and neck without acute findings. CT chest show evidence of right 10th rib fracture and a subtle right 11th rib fracture.  Small nodule noted.  She is a former smoker.  Although low risk on radiology read I did advise patient of this and advised her to notify her PCP so they can monitor this.  Patient stable for discharge.  Pain control discussed.  Discussed that we will not prescribe narcotic pain medication because of her alcohol use.  She is in agreement with this.  This would increase her risk of falls and respiratory depression.  Patient discharged in stable condition.  No evidence of pulmonary contusion.   Lab Tests: -I ordered, reviewed, and interpreted labs.   The pertinent results include:   Labs Reviewed - No data to display    EKG  EKG  Interpretation Date/Time:    Ventricular Rate:    PR Interval:    QRS Duration:    QT Interval:    QTC Calculation:   R Axis:      Text Interpretation:           Imaging Studies ordered: I ordered imaging studies including CT head, CT neck, CT chest without contrast I independently visualized and interpreted imaging. I agree with the radiologist interpretation   Medicines ordered and prescription drug management: Meds ordered this encounter  Medications   oxyCODONE  (  Oxy IR/ROXICODONE ) immediate release tablet 5 mg    Refill:  0   acetaminophen  (TYLENOL ) tablet 650 mg    -I have reviewed the patients home medicines and have made adjustments as needed  Reevaluation: After the interventions noted above, I reevaluated the patient and found that they have :improved  Co morbidities that complicate the patient evaluation  Past Medical History:  Diagnosis Date   Dysrhythmia    palpitations   Hypertension    MVP (mitral valve prolapse)       Dispostion: Discharged in stable condition.  Return precautions discussed.  Patient voices understanding and is in agreement with plan.   Final diagnoses:  Closed fracture of multiple ribs of right side, initial encounter    ED Discharge Orders          Ordered    lidocaine  (LIDODERM ) 5 %  Every 24 hours        03/29/24 1152               Hildegard Loge, PA-C 03/29/24 1152    Simon Lavonia SAILOR, MD 03/29/24 1225  "

## 2024-03-29 NOTE — ED Notes (Signed)
 Patient transported to CT

## 2024-03-29 NOTE — ED Notes (Signed)
 ED Provider at bedside.

## 2024-03-29 NOTE — Discharge Instructions (Addendum)
 You have 2 rib fractures 1 on the 10th rib and 1 on the 11th.  Continue to use the incentive spirometer.  Take Tylenol  1000 mg every 6 hours for pain control.  If you need additional pain medication take ibuprofen  600 mg every 6-8 hours.  Use the lidocaine  patch I have sent in.  If this is too expensive do not pick this up and use the 4% lidocaine  patch that you can buy over-the-counter.  Return for any emergent symptoms.  Follow-up with your primary care doctor beginning of next week for reevaluation.

## 2024-03-29 NOTE — ED Notes (Signed)
 RT at bedside with IS teaching.

## 2024-03-29 NOTE — ED Triage Notes (Signed)
 Pt caox4 ambulatory c/o R posterior rib pain since falling at home on Tuesday. Pt reports falling d/t ETOH intoxication causing her to black out and fall in her bedroom. Denies LOC. Denies SOB. Bruising to R lower back. Took meloxicam  at 0730.

## 2024-03-30 ENCOUNTER — Telehealth: Payer: Self-pay

## 2024-03-30 NOTE — Telephone Encounter (Signed)
 Transition Care Management Follow-up Telephone Call Date of discharge and from where: 03/29/2024- [facility] How have you been since you were released from the hospital? NO Any questions or concerns? No  Items Reviewed: Did the pt receive and understand the discharge instructions provided? Yes  Medications obtained and verified? Yes  Any new allergies since your discharge? No  Dietary orders reviewed? Yes Do you have support at home? No   Home Care and Equipment/Supplies: Were home health services ordered? not applicable  Has the agency set up a time to come to the patient's home? not applicable Were any new equipment or medical supplies ordered?  No Were you able to get the supplies/equipment? not applicable Do you have any questions related to the use of the equipment or supplies? No  Functional Questionnaire: (I = Independent and D = Dependent) ADLs: I  Bathing/Dressing- I  Meal Prep- I  Eating- I  Maintaining continence- I  Transferring/Ambulation- I  Managing Meds- I  Follow up appointments reviewed:  PCP Hospital f/u appt confirmed? Yes  Scheduled to see Dr. Kennyth on 04/05/24 @ 10:40 Specialist Hospital f/u appt confirmed? NA Are transportation arrangements needed? No  If their condition worsens, is the pt aware to call PCP or go to the Emergency Dept.? Yes Was the patient provided with contact information for the PCP's office or ED? Yes Was to pt encouraged to call back with questions or concerns? Yes

## 2024-04-05 ENCOUNTER — Encounter: Payer: Self-pay | Admitting: Family Medicine

## 2024-04-05 ENCOUNTER — Ambulatory Visit: Admitting: Family Medicine

## 2024-04-05 VITALS — BP 110/72 | HR 85 | Temp 97.8°F | Ht 63.0 in | Wt 130.0 lb

## 2024-04-05 DIAGNOSIS — F325 Major depressive disorder, single episode, in full remission: Secondary | ICD-10-CM

## 2024-04-05 DIAGNOSIS — I10 Essential (primary) hypertension: Secondary | ICD-10-CM

## 2024-04-05 DIAGNOSIS — S2241XD Multiple fractures of ribs, right side, subsequent encounter for fracture with routine healing: Secondary | ICD-10-CM

## 2024-04-05 DIAGNOSIS — F1090 Alcohol use, unspecified, uncomplicated: Secondary | ICD-10-CM | POA: Insufficient documentation

## 2024-04-05 MED ORDER — MELOXICAM 15 MG PO TABS: 15.0000 mg | ORAL_TABLET | Freq: Every day | ORAL | 0 refills | Status: AC

## 2024-04-05 NOTE — Assessment & Plan Note (Signed)
 Patient reports being abstinent over the last several days.  She has had significant issues with excessive alcohol use leading to falls and other issues we encouraged continued cessation.  We did discuss strategies to help her with this.  Also discussed referral to see counselor versus medication management however she declined for now.  She will let us  know if she changes her mind.

## 2024-04-05 NOTE — Progress Notes (Signed)
 "  Diana Case is a 69 y.o. female who presents today for an office visit.  Assessment/Plan:  New/Acute Problems: Right Rib Fracture  Imaging the ED showed right 10th and 11th rib fractures.  Pain is manageable.  We discussed typical course of refracture.  Will refill her meloxicam  today.  Chronic Problems Addressed Today: Alcohol use disorder Patient reports being abstinent over the last several days.  She has had significant issues with excessive alcohol use leading to falls and other issues we encouraged continued cessation.  We did discuss strategies to help her with this.  Also discussed referral to see counselor versus medication management however she declined for now.  She will let us  know if she changes her mind.  Depression, major, single episode, complete remission She is currently on Lexapro  10 mg daily and Wellbutrin  150 mg daily.  She feels like this current regimen is working well for her currently.  We did discuss importance of avoiding alcohol use as above.    Essential hypertension At goal today without meds.  Preventative health care-flu shot given today.    Subjective:  HPI:  See assessment / plan for status of chronic conditions.  Patient is here today for ED follow-up.  She went to the ED last week with right sided chest wall pain after falling at home.  Patient reportedly had too much to drink and fell when standing up.  In the ED and workup including CT chest, head, and neck.  Her CT head and neck did not show any acute findings however CT chest did show evidence of right 10th rib fracture and subtle right 11th rib fracture.  She was discharged home  Discussed the use of AI scribe software for clinical note transcription with the patient, who gave verbal consent to proceed.  History of Present Illness Diana Case is a 69 year old female who presents with follow-up after a fall resulting in rib fractures.  She presents for follow-up after  visiting the emergency department last week due to right-sided chest wall pain following a fall at home. The fall occurred after consuming excessive alcohol, leading to a loss of balance when standing up. A CT scan at the emergency department revealed a right tenth rib fracture and a subtle right eleventh rib fracture.  She describes the pain as being present when moving, but notes that it has improved over time. Initially, she experienced significant muscle spasms, which have since settled down. Her range of motion is 'pretty good' and the pain is manageable, although sneezing and coughing exacerbate it.  During the emergency department visit, a CT scan also identified a 3 mm perifoveal nodule in the right upper lobe. She was advised to check in with her primary care provider regarding this finding.  She has a history of alcohol use and mentions that the fall was related to excessive drinking. She and her husband had previously stopped drinking for ten months but relapsed due to stress related to family issues.  She is currently taking meloxicam  for pain management. She also mentions using Lexapro  and Wellbutrin , which she feels are effective now that she is not drinking.  She reports a history of two falls, both related to alcohol consumption, with the first occurring four weeks prior to the recent fall. She sustained a lump from the first fall, which she notes is healing as it is itching.  She has experienced significant stress recently, including the deaths of both her and her husband's fathers and  the process of clearing out the family home. She has been unable to see her therapist due to these circumstances.         Objective:  Physical Exam: BP 110/72   Pulse 85   Temp 97.8 F (36.6 C) (Temporal)   Ht 5' 3 (1.6 m)   Wt 130 lb (59 kg)   SpO2 96%   BMI 23.03 kg/m   Gen: No acute distress, resting comfortably CV: Regular rate and rhythm with no murmurs appreciated Pulm: Normal  work of breathing, clear to auscultation bilaterally with no crackles, wheezes, or rhonchi Neuro: Grossly normal, moves all extremities Psych: Normal affect and thought content      [name], MD 04/05/2024 11:16 AM  "

## 2024-04-05 NOTE — Assessment & Plan Note (Signed)
 She is currently on Lexapro  10 mg daily and Wellbutrin  150 mg daily.  She feels like this current regimen is working well for her currently.  We did discuss importance of avoiding alcohol use as above.

## 2024-04-05 NOTE — Assessment & Plan Note (Signed)
 At goal today without meds.

## 2024-04-05 NOTE — Patient Instructions (Signed)
 It was very nice to see you today!  VISIT SUMMARY: During your visit, we discussed your recent fall and rib fractures, alcohol use disorder, major depressive disorder, chronic knee pain, and general health maintenance.  YOUR PLAN: RIGHT RIB FRACTURES: You sustained right tenth and subtle right eleventh rib fractures from your fall. -Continue taking meloxicam  for pain management. -Healing is expected in the coming weeks.  ALCOHOL USE DISORDER: You recently relapsed due to stress, leading to falls and rib fractures. -You have stopped drinking and are considering abstinence. -We discussed the health impacts of alcohol and the increased risk of cancer. -I encouraged you to remain abstinent and offered support for therapy or counseling if needed.  MAJOR DEPRESSIVE DISORDER, SINGLE EPISODE: Your medications are effective when you are not consuming alcohol. -Continue taking Lexapro  and Wellbutrin  as prescribed.  CHRONIC PAIN OF LEFT KNEE: Your chronic knee pain is managed with your current medications. -Continue your current pain management regimen.  GENERAL HEALTH MAINTENANCE: We discussed the importance of flu vaccination due to your recent hospital visits and infection exposure. -You received a flu shot today.  No follow-ups on file.   Take care, Dr Kennyth  PLEASE NOTE:  If you had any lab tests, please let us  know if you have not heard back within a few days. You may see your results on mychart before we have a chance to review them but we will give you a call once they are reviewed by us .   If we ordered any referrals today, please let us  know if you have not heard from their office within the next week.   If you had any urgent prescriptions sent in today, please check with the pharmacy within an hour of our visit to make sure the prescription was transmitted appropriately.   Please try these tips to maintain a healthy lifestyle:  Eat at least 3 REAL meals and 1-2 snacks per day.   Aim for no more than 5 hours between eating.  If you eat breakfast, please do so within one hour of getting up.   Each meal should contain half fruits/vegetables, one quarter protein, and one quarter carbs (no bigger than a computer mouse)  Cut down on sweet beverages. This includes juice, soda, and sweet tea.   Drink at least 1 glass of water with each meal and aim for at least 8 glasses per day  Exercise at least 150 minutes every week.

## 2024-04-06 ENCOUNTER — Other Ambulatory Visit: Payer: Self-pay | Admitting: Family Medicine

## 2024-06-08 ENCOUNTER — Encounter: Admitting: Family Medicine
